# Patient Record
Sex: Male | Born: 1950 | Race: White | Hispanic: No | Marital: Single | Smoking: Current every day smoker
Health system: Southern US, Community
[De-identification: ages and names within clinical notes are randomized; demographics above are authoritative.]

## PROBLEM LIST (undated history)

## (undated) DIAGNOSIS — I739 Peripheral vascular disease, unspecified: Secondary | ICD-10-CM

## (undated) DIAGNOSIS — E1165 Type 2 diabetes mellitus with hyperglycemia: Secondary | ICD-10-CM

## (undated) DIAGNOSIS — R634 Abnormal weight loss: Secondary | ICD-10-CM

## (undated) DIAGNOSIS — E785 Hyperlipidemia, unspecified: Secondary | ICD-10-CM

## (undated) DIAGNOSIS — N529 Male erectile dysfunction, unspecified: Secondary | ICD-10-CM

## (undated) HISTORY — DX: Hyperlipidemia, unspecified: E78.5

## (undated) HISTORY — DX: Peripheral vascular disease, unspecified: I73.9

## (undated) HISTORY — DX: Male erectile dysfunction, unspecified: N52.9

## (undated) HISTORY — DX: Abnormal weight loss: R63.4

## (undated) HISTORY — DX: Type 2 diabetes mellitus with hyperglycemia: E11.65

---

## 1988-10-25 HISTORY — PX: CHOLECYSTECTOMY: SHX55

## 1993-10-25 HISTORY — PX: KNEE SURGERY: SHX244

## 1998-06-16 ENCOUNTER — Ambulatory Visit (HOSPITAL_BASED_OUTPATIENT_CLINIC_OR_DEPARTMENT_OTHER): Admission: RE | Admit: 1998-06-16 | Discharge: 1998-06-16 | Payer: Self-pay | Admitting: Orthopedic Surgery

## 2001-10-25 HISTORY — PX: BACK SURGERY: SHX140

## 2003-11-22 ENCOUNTER — Inpatient Hospital Stay (HOSPITAL_COMMUNITY): Admission: RE | Admit: 2003-11-22 | Discharge: 2003-11-24 | Payer: Self-pay | Admitting: Specialist

## 2010-08-27 ENCOUNTER — Ambulatory Visit: Payer: Self-pay | Admitting: Family Medicine

## 2010-08-27 DIAGNOSIS — N529 Male erectile dysfunction, unspecified: Secondary | ICD-10-CM

## 2010-08-27 DIAGNOSIS — R634 Abnormal weight loss: Secondary | ICD-10-CM

## 2010-08-27 DIAGNOSIS — I739 Peripheral vascular disease, unspecified: Secondary | ICD-10-CM

## 2010-08-27 DIAGNOSIS — E785 Hyperlipidemia, unspecified: Secondary | ICD-10-CM | POA: Insufficient documentation

## 2010-08-27 HISTORY — DX: Peripheral vascular disease, unspecified: I73.9

## 2010-08-27 HISTORY — DX: Abnormal weight loss: R63.4

## 2010-08-27 HISTORY — DX: Hyperlipidemia, unspecified: E78.5

## 2010-08-27 HISTORY — DX: Male erectile dysfunction, unspecified: N52.9

## 2010-08-27 LAB — CONVERTED CEMR LAB
Bilirubin Urine: NEGATIVE
Blood in Urine, dipstick: NEGATIVE
Ketones, urine, test strip: NEGATIVE
Nitrite: NEGATIVE
Specific Gravity, Urine: 1.02
Urobilinogen, UA: 0.2
WBC Urine, dipstick: NEGATIVE
pH: 5.5

## 2010-08-28 LAB — CONVERTED CEMR LAB
ALT: 15 units/L (ref 0–53)
AST: 16 units/L (ref 0–37)
Albumin: 3.9 g/dL (ref 3.5–5.2)
Alkaline Phosphatase: 91 units/L (ref 39–117)
BUN: 11 mg/dL (ref 6–23)
Basophils Absolute: 0.1 10*3/uL (ref 0.0–0.1)
Basophils Relative: 0.8 % (ref 0.0–3.0)
Bilirubin, Direct: 0.1 mg/dL (ref 0.0–0.3)
CO2: 29 meq/L (ref 19–32)
Calcium: 9.3 mg/dL (ref 8.4–10.5)
Chloride: 103 meq/L (ref 96–112)
Cholesterol: 280 mg/dL — ABNORMAL HIGH (ref 0–200)
Creatinine, Ser: 1 mg/dL (ref 0.4–1.5)
Direct LDL: 206.6 mg/dL
Eosinophils Absolute: 0.1 10*3/uL (ref 0.0–0.7)
Eosinophils Relative: 0.4 % (ref 0.0–5.0)
GFR calc non Af Amer: 76.98 mL/min (ref >60)
Glucose, Bld: 265 mg/dL — ABNORMAL HIGH (ref 70–99)
HCT: 51.2 % (ref 39.0–52.0)
HDL: 41.3 mg/dL (ref >39.00)
Hemoglobin: 17.9 g/dL — ABNORMAL HIGH (ref 13.0–17.0)
Lymphocytes Relative: 24.5 % (ref 12.0–46.0)
Lymphs Abs: 2.9 10*3/uL (ref 0.7–4.0)
MCHC: 34.9 g/dL (ref 30.0–36.0)
MCV: 91.3 fL (ref 78.0–100.0)
Monocytes Absolute: 0.8 10*3/uL (ref 0.1–1.0)
Monocytes Relative: 6.3 % (ref 3.0–12.0)
Neutro Abs: 8.2 10*3/uL — ABNORMAL HIGH (ref 1.4–7.7)
Neutrophils Relative %: 68 % (ref 43.0–77.0)
PSA: 1.02 ng/mL (ref 0.10–4.00)
Platelets: 209 10*3/uL (ref 150.0–400.0)
Potassium: 4.7 meq/L (ref 3.5–5.1)
RBC: 5.61 M/uL (ref 4.22–5.81)
RDW: 13.3 % (ref 11.5–14.6)
Sodium: 138 meq/L (ref 135–145)
TSH: 0.59 microintl units/mL (ref 0.35–5.50)
Testosterone: 375.76 ng/dL (ref 350.00–890.00)
Total Bilirubin: 0.8 mg/dL (ref 0.3–1.2)
Total CHOL/HDL Ratio: 7
Total Protein: 6.9 g/dL (ref 6.0–8.3)
Triglycerides: 223 mg/dL — ABNORMAL HIGH (ref 0.0–149.0)
VLDL: 44.6 mg/dL — ABNORMAL HIGH (ref 0.0–40.0)
WBC: 12 10*3/uL — ABNORMAL HIGH (ref 4.5–10.5)

## 2010-09-03 ENCOUNTER — Ambulatory Visit: Payer: Self-pay | Admitting: Family Medicine

## 2010-09-03 DIAGNOSIS — IMO0001 Reserved for inherently not codable concepts without codable children: Secondary | ICD-10-CM

## 2010-09-03 DIAGNOSIS — E119 Type 2 diabetes mellitus without complications: Secondary | ICD-10-CM

## 2010-09-03 HISTORY — DX: Reserved for inherently not codable concepts without codable children: IMO0001

## 2010-09-03 LAB — CONVERTED CEMR LAB: Blood Glucose, Fasting: 248 mg/dL

## 2010-09-04 ENCOUNTER — Ambulatory Visit: Payer: Self-pay | Admitting: Family Medicine

## 2010-09-04 LAB — CONVERTED CEMR LAB: Hgb A1c MFr Bld: 10.6 % — ABNORMAL HIGH (ref 4.6–6.5)

## 2010-10-07 ENCOUNTER — Ambulatory Visit: Payer: Self-pay | Admitting: Family Medicine

## 2010-10-08 LAB — CONVERTED CEMR LAB
ALT: 12 units/L (ref 0–53)
AST: 15 units/L (ref 0–37)
Albumin: 3.6 g/dL (ref 3.5–5.2)
Alkaline Phosphatase: 67 units/L (ref 39–117)
Bilirubin, Direct: 0.1 mg/dL (ref 0.0–0.3)
Cholesterol: 221 mg/dL — ABNORMAL HIGH (ref 0–200)
Direct LDL: 157.6 mg/dL
HDL: 34.5 mg/dL — ABNORMAL LOW (ref >39.00)
Total Bilirubin: 0.9 mg/dL (ref 0.3–1.2)
Total CHOL/HDL Ratio: 6
Total Protein: 6.7 g/dL (ref 6.0–8.3)
Triglycerides: 147 mg/dL (ref 0.0–149.0)
VLDL: 29.4 mg/dL (ref 0.0–40.0)

## 2010-11-17 ENCOUNTER — Encounter: Payer: Self-pay | Admitting: Family Medicine

## 2010-11-21 ENCOUNTER — Encounter: Payer: Self-pay | Admitting: Family Medicine

## 2010-11-26 NOTE — Assessment & Plan Note (Signed)
Summary: head/chest congestion/njr   Vital Signs:  Patient profile:   60 year old male O2 Sat:      96 % on Room air Temp:     98.1 degrees F oral Pulse rate:   73 / minute BP sitting:   110 / 68  (left arm) Cuff size:   regular  Vitals Entered By: Sid Falcon LPN (September 04, 2010 1:50 PM)  O2 Flow:  Room air  History of Present Illness: Patient followup with respiratory illness started couple days ago. Cough which is mostly nonproductive. Some associated nasal congestion. No fever. Has refused flu vaccine. Long history of smoking.  recent diagnosis type 2 diabetes. Started metformin and did have some diarrhea. Has backed off to once daily. Does not check blood sugar yet. Also started simvastatin for hyperlipidemia tolerating well.  Erectile dysfunction. Samples of Cialis 5 mg daily which seemed to be working very well and he is requesting prescription for that.  Allergies (verified): No Known Drug Allergies  Past History:  Past Medical History: Hyperlipidemia Kidney stones Type 2 diabetes Erectile dysfunction PMH reviewed for relevance  Review of Systems      See HPI  Physical Exam  General:  Well-developed,well-nourished,in no acute distress; alert,appropriate and cooperative throughout examination Head:  Normocephalic and atraumatic without obvious abnormalities. No apparent alopecia or balding. Ears:  External ear exam shows no significant lesions or deformities.  Otoscopic examination reveals clear canals, tympanic membranes are intact bilaterally without bulging, retraction, inflammation or discharge. Hearing is grossly normal bilaterally. Mouth:  Oral mucosa and oropharynx without lesions or exudates.  Teeth in good repair. Neck:  No deformities, masses, or tenderness noted. Lungs:  Normal respiratory effort, chest expands symmetrically. Lungs are clear to auscultation, no crackles or wheezes. Heart:  normal rate and regular rhythm.     Impression &  Recommendations:  Problem # 1:  VIRAL URI (ICD-465.9) No antibiiotics rec at this time.  Pt will start only if progressive productive cough or new fever. His updated medication list for this problem includes:    Aspirin 325 Mg Tabs (Aspirin) ..... Once daily  Problem # 2:  DIABETES MELLITUS, UNCONTROLLED (ICD-250.02) go back to once daily metformin for one week and then titrate to two times a day if toleration well. His updated medication list for this problem includes:    Aspirin 325 Mg Tabs (Aspirin) ..... Once daily    Metformin Hcl 500 Mg Tabs (Metformin hcl) ..... One by mouth two times a day  Complete Medication List: 1)  Aspirin 325 Mg Tabs (Aspirin) .... Once daily 2)  Vitamins  .... Once daily 3)  Simvastatin 20 Mg Tabs (Simvastatin) .... One tab daily at bedtime 4)  Metformin Hcl 500 Mg Tabs (Metformin hcl) .... One by mouth two times a day 5)  Onetouch Ultra System W/device Kit (Blood glucose monitoring suppl) .... Disp at ov today 09/03/10 6)  Onetouch Delica Lancets Misc (Lancets) .... Use 3-4 times a  week as directed 7)  Onetouch Test Strp (Glucose blood) .... Use 3-4 times a week as directed 8)  Cialis 5 Mg Tabs (Tadalafil) .... One by mouth once daily 9)  Doxycycline Hyclate 100 Mg Caps (Doxycycline hyclate) .... One by mouth two times a day for 10 days  Patient Instructions: 1)  Acute Bronchitis symptoms for less then 10 days are not  helped by antibiotics. Take over the counter cough medications. Call if no improvement in 5-7 days, sooner if increasing cough, fever, or new  symptoms ( shortness of breath, chest pain) .  Prescriptions: DOXYCYCLINE HYCLATE 100 MG CAPS (DOXYCYCLINE HYCLATE) one by mouth two times a day for 10 days  #20 x 0   Entered and Authorized by:   Evelena Peat MD   Signed by:   Evelena Peat MD on 09/04/2010   Method used:   Print then Give to Patient   RxID:   [id number] CIALIS 5 MG TABS (TADALAFIL) one by mouth once daily  #30 x 6    Entered and Authorized by:   Evelena Peat MD   Signed by:   Evelena Peat MD on 09/04/2010   Method used:   Print then Give to Patient   RxID:   [id number]    Orders Added: 1)  Est. Patient Level III [32951]

## 2010-11-26 NOTE — Assessment & Plan Note (Signed)
Summary: BRAND NEW PT/TO EST/PT REQ CPX/PT COMING IN FASTING/SELF PAY/CJR   Vital Signs:  Patient profile:   60 year old male Height:      70.25 inches Weight:      167 pounds BMI:     23.88 Temp:     98.5 degrees F oral Pulse rate:   88 / minute Pulse rhythm:   regular Resp:     12 per minute BP sitting:   120 / 78  (left arm) Cuff size:   regular  Vitals Entered By: Sid Falcon LPN (August 27, 2010 10:33 AM)   History of Present Illness: Here to establish.    No regular care or any CPE for years. Reports weight loss of about 30 pounds over past few years. Loss of muscle mass since back (cervical) surgery back in 2003. Increase fatigue and loss of stamina. One year problem of erectile dysfunction.  Does smoke. Also some mild curvature of penis.  Preventive Screening-Counseling & Management  Alcohol-Tobacco     Smoking Status: current     Packs/Day: 2.0     Year Started: 1965  Caffeine-Diet-Exercise     Does Patient Exercise: no  Allergies (verified): No Known Drug Allergies  Past History:  Family History: Last updated: 08/27/2010 Father, heart disease, deceased 62 heart attack Mother, good health Grandfather, lung [place]  Social History: Last updated: 08/27/2010 Occupation:  Self employed Single Current Smoker Alcohol use-yes, very occasional Regular exercise-no  Risk Factors: Exercise: no (08/27/2010)  Risk Factors: Smoking Status: current (08/27/2010) Packs/Day: 2.0 (08/27/2010)  Past Medical History: Hyperlipidemia Kidney stones  Past Surgical History: Cholecystectomy 1990 Left knee surgery 1995 Back surgery 2003 PMH-FH-SH reviewed for relevance  Family History: Father, heart disease, deceased 58 heart attack Mother, good health Grandfather, lung [place]  Social History: Occupation:  Self employed Single Current Smoker Alcohol use-yes, very occasional Regular exercise-no Occupation:  employed Smoking Status:  current Packs/Day:   2.0 Does Patient Exercise:  no  Review of Systems       The patient complains of weight loss and muscle weakness.  The patient denies anorexia, fever, weight gain, vision loss, hoarseness, chest pain, syncope, dyspnea on exertion, peripheral edema, prolonged cough, headaches, hemoptysis, abdominal pain, melena, hematochezia, severe indigestion/heartburn, hematuria, incontinence, suspicious skin lesions, transient blindness, depression, enlarged lymph nodes, and testicular masses.    Physical Exam  General:  Well-developed,well-nourished,in no acute distress; alert,appropriate and cooperative throughout examination Head:  Normocephalic and atraumatic without obvious abnormalities. No apparent alopecia or balding. Eyes:  pupils equal, pupils round, and pupils reactive to light.   Ears:  External ear exam shows no significant lesions or deformities.  Otoscopic examination reveals clear canals, tympanic membranes are intact bilaterally without bulging, retraction, inflammation or discharge. Hearing is grossly normal bilaterally. Mouth:  Oral mucosa and oropharynx without lesions or exudates.  Teeth in good repair. Neck:  No deformities, masses, or tenderness noted. Lungs:  Normal respiratory effort, chest expands symmetrically. Lungs are clear to auscultation, no crackles or wheezes. Heart:  normal rate, regular rhythm, and no gallop.   Abdomen:  soft, non-tender, no distention, no masses, no hepatomegaly, and no splenomegaly.   Rectal:  No external abnormalities noted. Normal sphincter tone. No rectal masses or tenderness. Prostate:  Prostate gland firm and smooth, no enlargement, nodularity, tenderness, mass, asymmetry or induration. Msk:  No deformity or scoliosis noted of thoracic or lumbar spine.   Extremities:  L foot sl cool to touch c/w R.  Slight slower cap  perfusion R compared with L. Neurologic:  alert & oriented X3, cranial nerves II-XII intact, and strength normal in all extremities.     Skin:  no rashes.   Cervical Nodes:  No lymphadenopathy noted Psych:  normally interactive, good eye contact, not anxious appearing, and not depressed appearing.     Impression & Recommendations:  Problem # 1:  Preventive Health Care (ICD-V70.0) discussed smoking cessation.  Needs colonoscopy screening but has no insurance and unable at this time. consider hemoccult cards.  Flu vaccine offered. He will check on last tetanus.  Problem # 2:  IMPOTENCE OF ORGANIC ORIGIN (ICD-607.84) samples Cialis 5 mg daily given. check testosterone levels with fatigue issues.  Problem # 3:  WEIGHT LOSS (ICD-783.21) check screening labs.   Problem # 4:  PERIPHERAL VASCULAR DISEASE (ICD-443.9) clinically very likely PVD LLE > RLE.  He is encouraged to stop smoking and cont ASA.  He needs arterial dopplers but he is  reluctant sec to cost at this time.  Complete Medication List: 1)  Aspirin 325 Mg Tabs (Aspirin) .... Once daily 2)  Vitamins  .... Once daily  Other Orders: UA Dipstick w/o Micro (automated)  (81003) Venipuncture (47829) Specimen Handling (56213) TLB-Lipid Panel (80061-LIPID) TLB-BMP (Basic Metabolic Panel-BMET) (80048-METABOL) TLB-CBC Platelet - w/Differential (85025-CBCD) TLB-Hepatic/Liver Function Pnl (80076-HEPATIC) TLB-TSH (Thyroid Stimulating Hormone) (84443-TSH) TLB-PSA (Prostate Specific Antigen) (84153-PSA) TLB-Testosterone, Total (84403-TESTO)  Patient Instructions: 1)  Cialis 5 mg one daily as needed    Orders Added: 1)  UA Dipstick w/o Micro (automated)  [81003] 2)  Venipuncture [08657] 3)  Specimen Handling [99000] 4)  TLB-Lipid Panel [80061-LIPID] 5)  TLB-BMP (Basic Metabolic Panel-BMET) [80048-METABOL] 6)  TLB-CBC Platelet - w/Differential [85025-CBCD] 7)  TLB-Hepatic/Liver Function Pnl [80076-HEPATIC] 8)  TLB-TSH (Thyroid Stimulating Hormone) [84443-TSH] 9)  TLB-PSA (Prostate Specific Antigen) [84153-PSA] 10)  TLB-Testosterone, Total [84403-TESTO] 11)   New Patient 40-64 years [99386]     Laboratory Results   Urine Tests    Routine Urinalysis   Color: yellow Appearance: Clear Glucose: 2+   (Normal Range: Negative) Bilirubin: negative   (Normal Range: Negative) Ketone: negative   (Normal Range: Negative) Spec. Gravity: 1.020   (Normal Range: 1.003-1.035) Blood: negative   (Normal Range: Negative) pH: 5.5   (Normal Range: 5.0-8.0) Protein: trace   (Normal Range: Negative) Urobilinogen: 0.2   (Normal Range: 0-1) Nitrite: negative   (Normal Range: Negative) Leukocyte Esterace: negative   (Normal Range: Negative)    Comments: Rita Ohara  August 27, 2010 11:54 AM

## 2010-11-26 NOTE — Assessment & Plan Note (Signed)
Summary: CBG & A1C/nn//NEEDED TO SEE DR. BURCHETTE//ALP   Vital Signs:  Patient profile:   60 year old male Weight:      171 pounds Temp:     98.1 degrees F oral BP sitting:   112 / 70  (left arm) Cuff size:   regular  Vitals Entered By: Duard Brady LPN (September 03, 2010 9:21 AM) CC: elevated CBG 248 fasting   History of Present Illness: Here today for followup fasting glucose. Recent fasting blood sugar 265. He has had some increased thirst but no urine frequency.  Weight loss of over 20 pounds this year.  Testosterone levels were normal. Lipids were very high and he started simvastatin. He has made some dietary changes with reduction of sugar-containing beverages.  Allergies (verified): No Known Drug Allergies  Past History:  Past Surgical History: Last updated: 08/27/2010 Cholecystectomy 1990 Left knee surgery 1995 Back surgery 2003  Family History: Last updated: 08/27/2010 Father, heart disease, deceased 55 heart attack Mother, good health Grandfather, lung [place]  Social History: Last updated: 08/27/2010 Occupation:  Self employed Single Current Smoker Alcohol use-yes, very occasional Regular exercise-no  Risk Factors: Exercise: no (08/27/2010)  Risk Factors: Smoking Status: current (08/27/2010) Packs/Day: 2.0 (08/27/2010)  Past Medical History: Hyperlipidemia Kidney stones Type 2 diabetes PMH-FH-SH reviewed for relevance  Physical Exam  General:  Well-developed,well-nourished,in no acute distress; alert,appropriate and cooperative throughout examination Lungs:  Normal respiratory effort, chest expands symmetrically. Lungs are clear to auscultation, no crackles or wheezes. Heart:  Normal rate and regular rhythm. S1 and S2 normal without gallop, murmur, click, rub or other extra sounds.   Impression & Recommendations:  Problem # 1:  DIABETES MELLITUS, UNCONTROLLED (ICD-250.02) Assessment New start metformin 500 mg b.i.d. Educational materials  given. Home glucose monitor given with instructions and will monitor at least twice weekly His updated medication list for this problem includes:    Aspirin 325 Mg Tabs (Aspirin) ..... Once daily    Metformin Hcl 500 Mg Tabs (Metformin hcl) ..... One by mouth two times a day  Problem # 2:  HYPERLIPIDEMIA (ICD-272.4)  His updated medication list for this problem includes:    Simvastatin 20 Mg Tabs (Simvastatin) ..... One tab daily at bedtime  Complete Medication List: 1)  Aspirin 325 Mg Tabs (Aspirin) .... Once daily 2)  Vitamins  .... Once daily 3)  Simvastatin 20 Mg Tabs (Simvastatin) .... One tab daily at bedtime 4)  Metformin Hcl 500 Mg Tabs (Metformin hcl) .... One by mouth two times a day 5)  Onetouch Ultra System W/device Kit (Blood glucose monitoring suppl) .... Disp at ov today 6)  Onetouch Delica Lancets Misc (Lancets) .... Use 3-4 times a  week as directed 7)  Onetouch Test Strp (Glucose blood) .... Use 3-4 times a week as directed  Patient Instructions: 1)  Continue to reduce sugars and starches in diet. 2)  Check blood sugars at least once or twice per week and give [place] some feedback on readings. 3)  Please schedule a follow-up appointment in 3 months .  Prescriptions: METFORMIN HCL 500 MG TABS (METFORMIN HCL) one by mouth two times a day  #60 x 5   Entered and Authorized by:   Evelena Peat MD   Signed by:   Evelena Peat MD on 09/03/2010   Method used:   Electronically to        [facility]  [address] [id number] (retail)       [address]  [place], [place]  [address]       Ph: [phone] or [phone]       Fax: [phone]   RxID:   [id number]    Orders Added: 1)  Est. Patient Level III [27253]

## 2010-12-03 ENCOUNTER — Encounter: Payer: Self-pay | Admitting: Family Medicine

## 2010-12-03 ENCOUNTER — Ambulatory Visit (INDEPENDENT_AMBULATORY_CARE_PROVIDER_SITE_OTHER): Payer: Self-pay | Admitting: Family Medicine

## 2010-12-03 VITALS — BP 120/72 | Temp 98.2°F | Ht 67.0 in | Wt 167.0 lb

## 2010-12-03 DIAGNOSIS — E119 Type 2 diabetes mellitus without complications: Secondary | ICD-10-CM

## 2010-12-03 DIAGNOSIS — N529 Male erectile dysfunction, unspecified: Secondary | ICD-10-CM

## 2010-12-03 DIAGNOSIS — E785 Hyperlipidemia, unspecified: Secondary | ICD-10-CM

## 2010-12-03 LAB — LIPID PANEL
Cholesterol: 192 mg/dL (ref 0–200)
HDL: 41.2 mg/dL (ref >39.00)
LDL Cholesterol: 128 mg/dL — ABNORMAL HIGH (ref 0–99)
Total CHOL/HDL Ratio: 5
VLDL: 22.4 mg/dL (ref 0.0–40.0)

## 2010-12-03 LAB — HEMOGLOBIN A1C: Hgb A1c MFr Bld: 8.4 % — ABNORMAL HIGH (ref 4.6–6.5)

## 2010-12-03 MED ORDER — SIMVASTATIN 80 MG PO TABS: ORAL_TABLET | ORAL | Status: DC

## 2010-12-03 MED ORDER — SILDENAFIL CITRATE 100 MG PO TABS: 100.0000 mg | ORAL_TABLET | ORAL | Status: AC | PRN

## 2010-12-03 MED ORDER — METFORMIN HCL 500 MG PO TABS: 500.0000 mg | ORAL_TABLET | Freq: Two times a day (BID) | ORAL | Status: DC

## 2010-12-03 NOTE — Progress Notes (Signed)
  Subjective:    Patient ID: Adam [name], male    DOB: 1951-07-20, 60 y.o.   MRN: [id number]  HPI   Patient seen for followup.   Type 2 diabetes with improvement in symptoms. On metformin 500 mg twice daily. No symptoms of hyperglycemia. Most recent A1c which was baseline of 10.6. Here today for followup. Overall feels much better.   erectile dysfunction. Some relief with Cialis. Still having problems with erection. Still smoking.    hyperlipidemia treated with simvastatin with recent increase in dosage to 40 mg daily. No myalgias. No history of CAD  Review of Systems  patient denies any appetite change or weight change. No headaches. No dyspnea. No chest pains.    Objective:   Physical Exam     Patient alert and in no distress.  Neck exam no mass  Chest clear to auscultation  Heart regular rhythm and rate with no murmur  Extremities no edema no foot lesions    Assessment & Plan:   #1   Type 2 diabetes. Improve symptomatically. Repeat hemoglobin A1c.    #2 erectile dysfunction. Try Viagra 100 mg one half to one tablet daily as needed  #3   Hyperlipidemia. Recheck lipid panel

## 2010-12-07 MED ORDER — METFORMIN HCL 500 MG PO TABS: ORAL_TABLET | ORAL | Status: DC

## 2010-12-07 NOTE — Progress Notes (Signed)
Addended by: Sid Falcon on: 12/07/2010 12:33 PM   Modules accepted: Orders

## 2010-12-07 NOTE — Progress Notes (Signed)
Quick Note:  Pt informed he needs to begin taking 1 tab in am, 2 tabs in pm. Pt will need Rx sent to his pharmacy to increase to 3 daily  ______

## 2011-03-12 NOTE — Op Note (Signed)
NAME:  Adam [name], Adam [name]                            ACCOUNT NO.:  [id number]   MEDICAL RECORD NO.:  [id number]                   PATIENT TYPE:  INP   LOCATION:  2861                                 FACILITY:  [facility]   PHYSICIAN:  Kerrin Champagne, M.D.                DATE OF BIRTH:  April 22, 1951   DATE OF PROCEDURE:  11/22/2003  DATE OF DISCHARGE:                                 OPERATIVE REPORT   PREOPERATIVE DIAGNOSIS:  Herniated nucleus pulposus in the left C6-7,  central herniated nucleus pulposus in the left C5-6, atrophic changes in the  left upper extremity secondary to nerve root compression.   POSTOPERATIVE DIAGNOSIS:  Herniated nucleus pulposus in the left C6-7,  central herniated nucleus pulposus in the left C5-6, atrophic changes in the  left upper extremity secondary to nerve root compression.   PROCEDURE:  Anterior cervical diskectomy and fusion in the C5-6, C6-7 with  right iliac crest bone graft harvested through [name] separate incision. Internal  fixation over the 2 levels using 49-mm DePuy locking blade and screws, 14-mm  screws were used, 2 revision screws were used on the right side at C7, on  the left side at C6.   SURGEON:  Kerrin Champagne, M.D.   ASSISTANT:  Wende Neighbors, P.[name].   ANESTHESIA:  GOT, Bedelia Person, M.D.   ESTIMATED BLOOD LOSS:  75 mL.   DRAINS:  [name] 10 French TLS drain left neck. Foley catheter to straight drain.   ESTIMATED BLOOD LOSS:  75 mL.   COMPLICATIONS:  None.   INDICATIONS FOR PROCEDURE:  The patient is [name] who has been  followed  with problems of disk protrusion in his neck, radiation into the  left upper extremity, developing progressive weakness in the left arm and  atrophy of his muscle, consistent with cervical radiculopathy in the C7  distribution. Studies have demonstrated disk herniation with spur formation  into the left C6-7 neuroforamen affecting triceps and left finger extension.  The patient also has [name] disk  protrusion in the C5-6 which is centrally  oriented. He  is taken to the operating room to undergo anterior cervical  diskectomy and fusion in both the C5-6 and C6-7 to relieve problems of  cervical radiculopathy.   FINDINGS:  Severe foraminal entrapment in the left C7 nerve root secondary  to uncovertebral spondylosis changes affecting the C7 nerve root. Disk  protrusion central right and leftward at the C5-6 level.   DESCRIPTION OF PROCEDURE:  After adequate general anesthesia, [name] Foley  catheter was placed and the patient was in the beachchair position. All  pressure points were well padded, and  5 pounds of cervical halter traction  with Mayfield horseshoe well padded to hold the occiput. Draped in the usual  manner. [name] bump under the right iliac crest. Standard prep with Duraprep  solution and then draped as noted. The right  iliac crest was iodine vidraped  as was the anterior neck.   An incision over  the left anterior neck approximately 3inches in length in  line with the patient's skin creases at the expected C6-7 level 2  fingerbreadths above the medial clavicle. Incision through skin  and subcu  layers directly down to the platysma layer. This was incised in line with  the skin incision and then developed. Blunt dissection was then used to  develop the interval between  the trachea and esophagus medially, brought [name]  sheath laterally to the anterior surface of  the cervical spine. The  prevertebral fascia was incised along the medial border of the longus coli  muscle and  teased across the midline using the key elevator. Cauterization  was used to free up the prevertebral fascia using bipolar electrocautery.   Spinal needles with sheaths left intact and allowing for only about 1 cm to  be inserted at each level were inserted at  the C5-6 and C6-7 level.  Intraoperative C-arm fluoroscopy was used to demonstrate the needles at the  C5-6 and C6-7 levels in both the AP and lateral  planes. The medial border of  the longus coli muscle was carefully freed up on both sides of the cervical  spine at the C5-6 and C6-7 levels. Spinal needles were removed under direct  observation and [name] small portion of the disk excised above C5-6 and C6-7 for  continued identification throughout the remainder of the case.   The longus coli muscle was freed up using electrocautery, then the  [facility] retractor was placed with the blade beneath the medial border of  the longus coli muscle at the C6-7 level initially. Bovie electrocautery was  used to carefully used to  cauterize soft tissue over the anterior aspect of  the cervical spine, extending from C7 upwards to C5, carefully cauterizing  this area down to bone to allow for placement of traction pins and later on  for play placement. The 14-mm distraction screws were placed in the  vertebral body of C6 and C7. Distraction was obtained across the  intervertebral disk space.   The anterior annular disk material was excised using [name] #15 blade  scalpel.  Pituitary rongeurs as well as 3-mm Kerrison. Anterior lip osteophytes were  resected using 3-mm Kerrisons. The disk was then debrided of disk material  using pituitary rongeurs as well as micro curets back 2/3rds of the way to  the posterior aspect of the disk space.   The intraoperative microscope was then brought into the field under sterile  conditions and then the remaining posterior portion of the disk was excised  using micro curets, pituitary rongeurs. The high-speed bur  was used to  carefully perform, opening into the uncovertebral region on the left side.  Posterior lip osteophytes were resected using 1-mm Kerrisons as well as 3-0  microcurets.   The left neuroforamen for C7 was noted to be significantly narrow as well as  multiple small  fragments of disk material entering in the left  neuroforamen. These were removed using  micro pituitaries as well as Kerrisons. The  uncovertebral osteophyte was resected on the left side such  that the left C7 nerve root eventually was noted to be making its turn and  returning anteriorly  and laterally, exiting from the neuroforamen, at which  point it was felt that decompression was complete.   Irrigation was performed. The high-speed bur was used to carefully smooth  the endplates at  both the inferior aspect of C6, superior aspect of C7. The  height in the intervertebral disk space was measured using [name] sounder; the #8  sounder gave the best fit. Iliac crest bone graft was harvested in [name]  tricortical fashion from the right side through an incision about 2 to 2-1/2  inches in length through the skin and subcutaneous layers and about  2-1/2 to 3 inches back from the anterior superior iliac spine. The incision  through the skin and subcutaneous layers directly down to the periosteum in  the interval between  the  abdominal  fascia and the upper thigh fascia.   Electrocautery was used to carefully perform subperiosteal dissection medial  and lateral. Retractors were placed. The oscillating saw set at 8 mm was  then used to incise the crest. This was cut across the base with [name] 1/4-inch  osteotome protecting soft tissue structures with [employer] as well as Cobb  elevators.   This was then removed. Carefully tapered dimensions in the intervertebral  disk space. The intervertebral disk space height was measured using the  sounder provided, the depth measured at 18-mm using the Cloward depth gauge.  [name] 14-mm depth graft was chosen. The height as stated.   Carefully the graft was tapered to the dimensions in the intervertebral disk  space. Irrigation was performed in the intervertebral disk space. Care was  taken to ensure no remaining soft tissue was within the disk space that  could be retropulsed with insertion of the graft. The graft  was then  inserted and impacted into place, sub set  about  1 to 2 mm.   The  distraction was removed. The distraction pins were then removed from the  C7 vertebral body. Bone was applied to the cancellous bone surface area. The  McCullough retractor was then carefully removed and then reinserted at the  C5-6 level with the blade beneath the medial border of the longus coli  muscle. Distraction was obtained and excellent visualization of the C5-6  level was obtained. [name] distracting pin was then placed parallel to that of  the C6 level into the C5 vertebral body. Distraction was obtained across  this disk space.   The #15 blade  scalpel was used to incise the disk. Pituitary rongeurs were  used to incise the disk material from C5-6 anteriorly. The operating room  microscope was brought into the field under direct visualization, then the  anterior lip osteophytes were resected off the C5-6 level using 3-mm  Kerrisons. The high-speed bur was used to remove the cartilaginous endplates  both at the inferior aspect of the C5 and the superior aspect of the C6 back to the posterior lip osteophytes which were present here. These were  resected using 1-mm Kerrison as well as [name] 3-0 forward angle microcuret.   The posterior annular fibers were resected. The posterior longitudinal  ligament was resected. Disk material was found to be present within the  centrally and towards the right side. This was resected using micro  pituitary rongeurs. [name] foraminotomy was performed over the left side at C5-6,  decompressing the left C6 nerve root. Irrigation was performed.   The height of the intervertebral disk space was measured with an 8-mm  sounder and found to be excellent size. The depth was measured and again  measuring 18-mm in depth at this level. The iliac crest bone graft was  harvested from the right side using [name] dual oscillating saw provided. It was  divided across  its base with [name] 1/4-inch osteotome.   This graft was carefully tapered to the dimensions of the intervertebral   disk space, 14 mm in depth at [name] height of 8 mm. It was  carefully keyed to  allow for insertion into the disk space. After inspection of the disk space  and further  irrigation of the soft tissue felt to be present, it could be  retropulsed with insertion of the graft. The graft was then inserted in  place and packed into place. Retro set or sub set about 1 mm below the  anterior lip of the C5-6 level.   The screw post was then removed from both C5 and C6. Bone wax was applied to  the bleeding screw post holes. Electrocautery was used to carefully  cauterize soft tissue bleeders. The small thyroid artery that appeared  to  be begin bleeding during the case was carefully suture ligated off over its  medial lateral  aspect.   The high-speed bur  under direct visualization with loupe magnification was  then used to carefully smooth the anterior aspect of the disk space at C5-6  and C6-7, removing anterior lip osteophytes and smoothing this area for  acceptance of the plate. The expected plate length was measured using bone  wax coated cottonoid string and placed over the anterior cervical spine,  measuring almost 50 mm. [name] 49-mm plate was eventually chosen.   This plate was precontoured with cervical lordotic curve was used. The 5  pounds of cervical traction was released off the neck. The plate carefully  medial to the central portion of the vertebral bodies of C5, C6 and C7.  Pinned in place using [name] temporary fixation pin at the C7 level. The screws  were placed at the C6 level, first the left side 14-mm screw and then on the  right side [name] 14-mm screw at C6. Next screws were placed at C5, 14-mm screws  were placed on both right and left side. At the C7 level then additional  screws were placed, the 14-mm on the left, 14-mm on the right. The right  screw appeared  to be somewhat loose so that [name] revision  screw was placed.   The left side C6 screw was also felt to be loose, so this was  replaced with [name] revision 14-mm screw. Each of the locking fasteners were then turned using  the screwdriver provided, rotating the nut about 90 degrees at least to  obtain fixational locking of each of the screws of the plate.   Irrigation was performed. Carefully small bleeders were controlled using  bipolar electrocautery. Inspection of the esophagus demonstrated no  abnormality. [name] 10 French TLS drain was placed in the depth of the incision,  exiting just inferior to the skin  incision and was sewn in place with [name] 4-0  nylon stitch.   Following evaluation  of the esophagus and when it was determined to be  normal. Irrigation was again  performed in the platysma layer and  then  reapproximated with interrupted 3-0 Vicryl sutures. The deep subcutaneous  layers were reapproximated with interrupted 3-0 Vicryl suture. The skin was  closed with [name] running subcu stitch of 4-0 Vicryl. Tincture of Benzoin and  Steri-Strips were then applied to the neck. The TLS drain was then charged  with  __________.   The right iliac crest bone graft harvest site was  carefully  irrigated. The  C-arm fluoroscopy was used to ascertain position  and alignment  of the plate  and indicated there was no evidence of retropulsion or bone graft material,  nor was there any evidence of significant abnormality associated with plate  or screw length.   The right iliac crest was irrigated with copious amounts of irrigant  solution. Bone wax was applied to the bleeding cancellous bone surfaces as  was Gelfoam. The abdominal fascia was reapproximated to the upper thigh  fascia using interrupted #1 Vicryl sutures. The deep subcu layers were  reapproximated with interrupted #1 and  0  Vicryl sutures. The more  superficial layers with interrupted 2-0 Vicryl sutures and the skin was  closed with [name] running subcu stitch of 4-0 Vicryl. Tincture of Benzoin and  Steri-Strips were applied. [name] dry dressing and  4 x 4 were affixed to  the  skin with Hypafix tape over the right iliac crest and left neck.   The patient was then placed into [name] [place] collar, reactivated,  extubated and returned to the recovery room in satisfactory condition. All  sponge and instrument counts were correct.                                               Kerrin Champagne, M.D.    Myra Rude  D:  11/22/2003  T:  11/23/2003  Job:  [id number]

## 2011-03-12 NOTE — Discharge Summary (Signed)
NAME:  Adam [name], Adam [name]                            ACCOUNT NO.:  [id number]   MEDICAL RECORD NO.:  [id number]                   PATIENT TYPE:  INP   LOCATION:  5037                                 FACILITY:  [facility]   PHYSICIAN:  Kerrin Champagne, M.D.                DATE OF BIRTH:  Apr 14, 1951   DATE OF ADMISSION:  11/22/2003  DATE OF DISCHARGE:  11/24/2003                                 DISCHARGE SUMMARY   ADMISSION DIAGNOSES:  Herniated nucleus pulposus, left C6-7. Herniated  nucleus pulposus, left C5-6, with atrophic changes in the left upper  extremity secondary to nerve root compression.   DISCHARGE DIAGNOSES:  Herniated nucleus pulposus, left C6-7. Herniated  nucleus pulposus, left C5-6, with atrophic changes in the left upper  extremity secondary to nerve root compression.   PROCEDURE:  On November 22, 2003, the patient underwent anterior cervical  diskectomy and fusion at C5-6 and C6-7 with right iliac crest bone graft  harvested through [name] separate incision. This was performed by Dr. Otelia Sergeant,  assisted by Wende Neighbors, P.[name].-C, under general anesthesia.   CONSULTS:  None.   BRIEF HISTORY:  The patient is [name] 60 year old [place] male with chronic pain  and atrophic changes in the left upper extremity secondary to [name] disk  protrusion at left C6-7 as well as [name] disk protrusion at C5-6. His exam has  shown progressive weakness in the left upper extremity and atrophy of his  muscle consistent with cervical radiculopathy in the C7 distribution.  Studies have confirmed disk herniation with spur formation into the left C6-  7 neural foramen affecting triceps and left finger extension as well as [name]  disk protrusion at C5-6 level which is centrally located. It was felt he  would require surgical intervention and was admitted for the procedure as  stated above.   BRIEF HOSPITAL COURSE:  The patient tolerated the procedure under general  anesthesia without complications. Postoperatively,  neurovascular motor  function of the upper extremities was intact. He was treated for pain  control with PCA analgesics and weaned to p.o. analgesics without  difficulty. Sore throat was treated with Cepacol lozenges successfully. The  patient received [name] physical therapy consult for ambulation and gait  training. He tolerated this well and was independent with activity at the  time of discharge. On the first postoperative day, his drain was removed  from the anterior cervical wound. Dressing changes were done daily  thereafter with the wound healing well. Daily dressing changes were also  done on the right hip, and this wound was noted to be healing without  drainage as well. On the second postoperative day, the patient was  comfortable with oral analgesics. He was able to eat and swallow well. The  patient was afebrile with vital signs stable and felt ready for discharge to  home.   PERTINENT LABORATORY DATA:  Admission labs include CBC  with RBC 5.93,  hemoglobin 18.1, hematocrit 52.4, WBC 8.8. Coagulation studies were within  normal limits. Chemistry studies were normal with the exception of glucose  301. Urinalysis on admission was negative for urinary tract infection. EKG  on admission showed normal sinus rhythm with left axis deviation. No old  tracings for comparison, confirmed by Dr. Dietrich Pates. Preoperative chest x-ray  is not available on the record at the time of this dictation; however, chest  x-ray documented by preanesthesia evaluation showed chronic bronchitis. The  patient was known to have [name] smoking history.   CONDITION ON DISCHARGE:  Stable.   PLAN:  The patient is discharged to his home. He is given instructions to  wear his [place] collar at all times. He is also given [name] [place] collar  which he will use for showering purposes. Dressing changes will be done  daily and patient was given supplies and instructions on doing so. He will  continue on [name] soft diet and  advance as tolerated. The patient will avoid  overhead activity and no lifting greater than 5 pounds. Prescriptions were  given Percocet p.r.n. pain and Robaxin p.r.n. spasm. He will utilize over-  the-counter stool softeners or laxatives as needed. He was instructed to  avoid use of anti-inflammatory medications or aspirin during the healing  process. He will follow up in two weeks from the date of surgery with Dr.  Otelia Sergeant and was given [name] number to call to arrange the appointment. All  questions were encouraged and answered. He will call the office if he has  problems prior to his return office visit. The patient was also advised to  followup with his primary care physician in regards to his elevated blood  sugar on admission.      Wende Neighbors, P.[name].                    Kerrin Champagne, M.D.    [facility]  D:  12/31/2003  T:  01/01/2004  Job:  [id number]

## 2011-10-05 ENCOUNTER — Other Ambulatory Visit: Payer: Self-pay | Admitting: Family Medicine

## 2011-11-01 ENCOUNTER — Other Ambulatory Visit: Payer: Self-pay | Admitting: Family Medicine

## 2011-11-26 ENCOUNTER — Other Ambulatory Visit: Payer: Self-pay | Admitting: Family Medicine

## 2011-12-23 ENCOUNTER — Other Ambulatory Visit: Payer: Self-pay | Admitting: Family Medicine

## 2013-01-06 ENCOUNTER — Other Ambulatory Visit: Payer: Self-pay | Admitting: Family Medicine

## 2013-03-10 ENCOUNTER — Other Ambulatory Visit: Payer: Self-pay | Admitting: Family Medicine

## 2013-04-18 ENCOUNTER — Other Ambulatory Visit: Payer: Self-pay | Admitting: Family Medicine

## 2013-04-18 NOTE — Telephone Encounter (Signed)
Denied--Pt last office visit 02.09.2012, 2nd month refused/SLS

## 2013-04-26 ENCOUNTER — Ambulatory Visit (INDEPENDENT_AMBULATORY_CARE_PROVIDER_SITE_OTHER): Payer: Self-pay | Admitting: Family Medicine

## 2013-04-26 ENCOUNTER — Encounter: Payer: Self-pay | Admitting: Family Medicine

## 2013-04-26 VITALS — BP 136/72 | HR 79 | Temp 98.0°F | Ht 72.0 in | Wt 175.0 lb

## 2013-04-26 DIAGNOSIS — E785 Hyperlipidemia, unspecified: Secondary | ICD-10-CM

## 2013-04-26 DIAGNOSIS — E1165 Type 2 diabetes mellitus with hyperglycemia: Secondary | ICD-10-CM

## 2013-04-26 LAB — HM DIABETES EYE EXAM

## 2013-04-26 MED ORDER — SIMVASTATIN 80 MG PO TABS: ORAL_TABLET | ORAL | Status: DC

## 2013-04-26 MED ORDER — METFORMIN HCL 500 MG PO TABS: ORAL_TABLET | ORAL | Status: DC

## 2013-04-26 NOTE — Progress Notes (Signed)
  Subjective:    Patient ID: Adam [name], male    DOB: Feb 08, 1951, 62 y.o.   MRN: [id number]  HPI  Patient seen for medical follow up. Not seen in 2 years. History of poor compliance secondary to lack of insurance and cost issues. He has history of type 2 diabetes, ongoing nicotine use, and hyperlipidemia. He's been out of his medications for about 3 weeks now. Has been taking metformin, simvastatin, aspirin.  Denies any recent chest pains. No symptoms of hyper or hypoglycemia. When testing in the past blood sugars have been mostly low 100s. Last A1c was 8.4% this was back in 2012. No recent eye exam.  Past Medical History  Diagnosis Date  . DIABETES MELLITUS, UNCONTROLLED 09/03/2010  . HYPERLIPIDEMIA 08/27/2010  . PERIPHERAL VASCULAR DISEASE 08/27/2010  . 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 WEIGHT LOSS 08/27/2010   Past Surgical History  Procedure Laterality Date  . Cholecystectomy  1990  . Knee surgery  1995    left  . Back surgery  2003    reports that he has been smoking.  He does not have any smokeless tobacco history on file. He reports that  drinks alcohol. His drug history is not on file. family history includes Cancer in his paternal grandfather and Heart disease in his father. No Known Allergies   Review of Systems  Constitutional: Negative for fatigue.  Eyes: Negative for visual disturbance.  Respiratory: Negative for cough, chest tightness and shortness of breath.   Cardiovascular: Negative for chest pain, palpitations and leg swelling.  Endocrine: Negative for polydipsia, polyphagia and polyuria.  Genitourinary: Negative for dysuria.  Neurological: Negative for dizziness, syncope, weakness, light-headedness and headaches.       Objective:   Physical Exam  Constitutional: He appears well-developed and well-nourished.  Neck: Neck supple. No thyromegaly present.  Cardiovascular: Normal rate and regular rhythm.   Pulmonary/Chest: Effort normal and breath  sounds normal. No respiratory distress. He has no wheezes. He has no rales.  Musculoskeletal: He exhibits no edema.  Skin:  Feet are warm to touch with 2+ dorsalis pedis pulses and good capillary refill. No lesions. Normal sensory function          Assessment & Plan:  #1 type 2 diabetes. History of poor compliance with medications. Refilled metformin. We elected not to do A1c today since he been off this medication for the past few weeks. Get back on medication and future labs ordered with A1c in 3 months #2 hyperlipidemia. Get back on simvastatin 40 mg daily and reassess lipids at followup in 3 months

## 2013-04-26 NOTE — Patient Instructions (Addendum)
Type 2 Diabetes Mellitus, Adult Type 2 diabetes mellitus, often simply referred to as type 2 diabetes, is a long-lasting (chronic) disease. In type 2 diabetes, the pancreas does not make enough insulin (a hormone), the cells are less responsive to the insulin that is made (insulin resistance), or both. Normally, insulin moves sugars from food into the tissue cells. The tissue cells use the sugars for energy. The lack of insulin or the lack of normal response to insulin causes excess sugars to build up in the blood instead of going into the tissue cells. As a result, high blood sugar (hyperglycemia) develops. The effect of high sugar (glucose) levels can cause many complications. Type 2 diabetes was also previously called adult-onset diabetes but it can occur at any age.  RISK FACTORS  A person is predisposed to developing type 2 diabetes if someone in the family has the disease and also has one or more of the following primary risk factors:  Overweight.  An inactive lifestyle.  A history of consistently eating high-calorie foods. Maintaining a normal weight and regular physical activity can reduce the chance of developing type 2 diabetes. SYMPTOMS  A person with type 2 diabetes may not show symptoms initially. The symptoms of type 2 diabetes appear slowly. The symptoms include:  Increased thirst (polydipsia).  Increased urination (polyuria).  Increased urination during the night (nocturia).  Weight loss. This weight loss may be rapid.  Frequent, recurring infections.  Tiredness (fatigue).  Weakness.  Vision changes, such as blurred vision.  Fruity smell to your breath.  Abdominal pain.  Nausea or vomiting.  Cuts or bruises which are slow to heal.  Tingling or numbness in the hands or feet. DIAGNOSIS Type 2 diabetes is frequently not diagnosed until complications of diabetes are present. Type 2 diabetes is diagnosed when symptoms or complications are present and when blood  glucose levels are increased. Your blood glucose level may be checked by one or more of the following blood tests:  A fasting blood glucose test. You will not be allowed to eat for at least 8 hours before a blood sample is taken.  A random blood glucose test. Your blood glucose is checked at any time of the day regardless of when you ate.  A hemoglobin A1c blood glucose test. A hemoglobin A1c test provides information about blood glucose control over the previous 3 months.  An oral glucose tolerance test (OGTT). Your blood glucose is measured after you have not eaten (fasted) for 2 hours and then after you drink a glucose-containing beverage. TREATMENT   You may need to take insulin or diabetes medicine daily to keep blood glucose levels in the desired range.  You will need to match insulin dosing with exercise and healthy food choices. The treatment goal is to maintain the before meal blood sugar (preprandial glucose) level at 70 130 mg/dL. HOME CARE INSTRUCTIONS   Have your hemoglobin A1c level checked twice a year.  Perform daily blood glucose monitoring as directed by your caregiver.  Monitor urine ketones when you are ill and as directed by your caregiver.  Take your diabetes medicine or insulin as directed by your caregiver to maintain your blood glucose levels in the desired range.  Never run out of diabetes medicine or insulin. It is needed every day.  Adjust insulin based on your intake of carbohydrates. Carbohydrates can raise blood glucose levels but need to be included in your diet. Carbohydrates provide vitamins, minerals, and fiber which are an essential part of   a healthy diet. Carbohydrates are found in fruits, vegetables, whole grains, dairy products, legumes, and foods containing added sugars.    Eat healthy foods. Alternate 3 meals with 3 snacks.  Lose weight if overweight.  Carry a medical alert card or wear your medical alert jewelry.  Carry a 15 gram  carbohydrate snack with you at all times to treat low blood glucose (hypoglycemia). Some examples of 15 gram carbohydrate snacks include:  Glucose tablets, 3 or 4   Glucose gel, 15 gram tube  Raisins, 2 tablespoons (24 grams)  Jelly beans, 6  Animal crackers, 8  Regular pop, 4 ounces (120 mL)  Gummy treats, 9  Recognize hypoglycemia. Hypoglycemia occurs with blood glucose levels of 70 mg/dL and below. The risk for hypoglycemia increases when fasting or skipping meals, during or after intense exercise, and during sleep. Hypoglycemia symptoms can include:  Tremors or shakes.  Decreased ability to concentrate.  Sweating.  Increased heart rate.  Headache.  Dry mouth.  Hunger.  Irritability.  Anxiety.  Restless sleep.  Altered speech or coordination.  Confusion.  Treat hypoglycemia promptly. If you are alert and able to safely swallow, follow the 15:15 rule:  Take 15 20 grams of rapid-acting glucose or carbohydrate. Rapid-acting options include glucose gel, glucose tablets, or 4 ounces (120 mL) of fruit juice, regular soda, or low fat milk.  Check your blood glucose level 15 minutes after taking the glucose.  Take 15 20 grams more of glucose if the repeat blood glucose level is still 70 mg/dL or below.  Eat a meal or snack within 1 hour once blood glucose levels return to normal.    Be alert to polyuria and polydipsia which are early signs of hyperglycemia. An early awareness of hyperglycemia allows for prompt treatment. Treat hyperglycemia as directed by your caregiver.  Engage in at least 150 minutes of moderate-intensity physical activity a week, spread over at least 3 days of the week or as directed by your caregiver. In addition, you should engage in resistance exercise at least 2 times a week or as directed by your caregiver.  Adjust your medicine and food intake as needed if you start a new exercise or sport.  Follow your sick day plan at any time you  are unable to eat or drink as usual.  Avoid tobacco use.  Limit alcohol intake to no more than 1 drink per day for nonpregnant women and 2 drinks per day for men. You should drink alcohol only when you are also eating food. Talk with your caregiver whether alcohol is safe for you. Tell your caregiver if you drink alcohol several times a week.  Follow up with your caregiver regularly.  Schedule an eye exam soon after the diagnosis of type 2 diabetes and then annually.  Perform daily skin and foot care. Examine your skin and feet daily for cuts, bruises, redness, nail problems, bleeding, blisters, or sores. A foot exam by a caregiver should be done annually.  Brush your teeth and gums at least twice a day and floss at least once a day. Follow up with your dentist regularly.  Share your diabetes management plan with your workplace or school.  Stay up-to-date with immunizations.  Learn to manage stress.  Obtain ongoing diabetes education and support as needed.  Participate in, or seek rehabilitation as needed to maintain or improve independence and quality of life. Request a physical or occupational therapy referral if you are having foot or hand numbness or difficulties with grooming,   dressing, eating, or physical activity. SEEK MEDICAL CARE IF:   You are unable to eat food or drink fluids for more than 6 hours.  You have nausea and vomiting for more than 6 hours.  Your blood glucose level is over 240 mg/dL.  There is a change in mental status.  You develop an additional serious illness.  You have diarrhea for more than 6 hours.  You have been sick or have had a fever for a couple of days and are not getting better.  You have pain during any physical activity.  SEEK IMMEDIATE MEDICAL CARE IF:  You have difficulty breathing.  You have moderate to large ketone levels. MAKE SURE YOU:  Understand these instructions.  Will watch your condition.  Will get help right away if  you are not doing well or get worse. Document Released: 10/11/2005 Document Revised: 07/05/2012 Document Reviewed: 05/09/2012 Exit[employer] Patient Information 2014 [employer].  

## 2014-05-07 ENCOUNTER — Other Ambulatory Visit: Payer: Self-pay | Admitting: Family Medicine

## 2014-05-09 ENCOUNTER — Encounter: Payer: Self-pay | Admitting: Family Medicine

## 2014-05-09 ENCOUNTER — Ambulatory Visit (INDEPENDENT_AMBULATORY_CARE_PROVIDER_SITE_OTHER): Payer: Medicare Other | Admitting: Family Medicine

## 2014-05-09 VITALS — BP 112/68 | HR 72 | Wt 167.0 lb

## 2014-05-09 DIAGNOSIS — E785 Hyperlipidemia, unspecified: Secondary | ICD-10-CM

## 2014-05-09 DIAGNOSIS — IMO0001 Reserved for inherently not codable concepts without codable children: Secondary | ICD-10-CM

## 2014-05-09 DIAGNOSIS — N529 Male erectile dysfunction, unspecified: Secondary | ICD-10-CM

## 2014-05-09 DIAGNOSIS — Z23 Encounter for immunization: Secondary | ICD-10-CM | POA: Diagnosis not present

## 2014-05-09 DIAGNOSIS — N528 Other male erectile dysfunction: Secondary | ICD-10-CM

## 2014-05-09 DIAGNOSIS — I739 Peripheral vascular disease, unspecified: Secondary | ICD-10-CM | POA: Diagnosis not present

## 2014-05-09 DIAGNOSIS — E1165 Type 2 diabetes mellitus with hyperglycemia: Principal | ICD-10-CM

## 2014-05-09 LAB — HM DIABETES EYE EXAM

## 2014-05-09 LAB — HEPATIC FUNCTION PANEL
ALBUMIN: 3.8 g/dL (ref 3.5–5.2)
ALT: 16 U/L (ref 0–53)
AST: 19 U/L (ref 0–37)
Alkaline Phosphatase: 82 U/L (ref 39–117)
BILIRUBIN DIRECT: 0.1 mg/dL (ref 0.0–0.3)
TOTAL PROTEIN: 7.2 g/dL (ref 6.0–8.3)
Total Bilirubin: 1.2 mg/dL (ref 0.2–1.2)

## 2014-05-09 LAB — BASIC METABOLIC PANEL
BUN: 6 mg/dL (ref 6–23)
CHLORIDE: 101 meq/L (ref 96–112)
CO2: 29 mEq/L (ref 19–32)
Calcium: 9.1 mg/dL (ref 8.4–10.5)
Creatinine, Ser: 0.9 mg/dL (ref 0.4–1.5)
GFR: 87.63 mL/min (ref >60.00)
Glucose, Bld: 186 mg/dL — ABNORMAL HIGH (ref 70–99)
Potassium: 3.4 mEq/L — ABNORMAL LOW (ref 3.5–5.1)
Sodium: 136 mEq/L (ref 135–145)

## 2014-05-09 LAB — LIPID PANEL
Cholesterol: 175 mg/dL (ref 0–200)
HDL: 44.4 mg/dL (ref >39.00)
LDL Cholesterol: 101 mg/dL — ABNORMAL HIGH (ref 0–99)
NonHDL: 130.6
TRIGLYCERIDES: 148 mg/dL (ref 0.0–149.0)
Total CHOL/HDL Ratio: 4
VLDL: 29.6 mg/dL (ref 0.0–40.0)

## 2014-05-09 LAB — MICROALBUMIN / CREATININE URINE RATIO
Creatinine,U: 54.3 mg/dL
Microalb Creat Ratio: 0.6 mg/g (ref 0.0–30.0)
Microalb, Ur: 0.3 mg/dL (ref 0.0–1.9)

## 2014-05-09 LAB — HM DIABETES FOOT EXAM: HM Diabetic Foot Exam: NORMAL

## 2014-05-09 LAB — HEMOGLOBIN A1C: HEMOGLOBIN A1C: 8.8 % — AB (ref 4.6–6.5)

## 2014-05-09 MED ORDER — METFORMIN HCL 500 MG PO TABS: ORAL_TABLET | ORAL | Status: DC

## 2014-05-09 MED ORDER — SIMVASTATIN 80 MG PO TABS: ORAL_TABLET | ORAL | Status: DC

## 2014-05-09 NOTE — Patient Instructions (Signed)
Schedule physical exam for later this year. We will call you with lab work that was drawn today

## 2014-05-09 NOTE — Progress Notes (Signed)
Pre visit review using our clinic review tool, if applicable. No additional management support is needed unless otherwise documented below in the visit note. 

## 2014-05-09 NOTE — Progress Notes (Signed)
   Subjective:    Patient ID: Adam [name], male    DOB: 10/11/1951, 63 y.o.   MRN: [id number]  HPI Patient seen for medical followup. He has not had insurance until this year and has had very poor compliance with followup. Type 2 diabetes, ongoing nicotine use, dyslipidemia, erectile dysfunction. He takes simvastatin, metformin, and aspirin. No claudication symptoms. Ongoing erectile dysfunction issues. No chest pains. No cardiac history. No history of pneumonia vaccine. Low motivation to quit smoking. He has not had labs in over 2 years.  Past Medical History  Diagnosis Date  . DIABETES MELLITUS, UNCONTROLLED 09/03/2010  . HYPERLIPIDEMIA 08/27/2010  . PERIPHERAL VASCULAR DISEASE 08/27/2010  . 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 WEIGHT LOSS 08/27/2010   Past Surgical History  Procedure Laterality Date  . Cholecystectomy  1990  . Knee surgery  1995    left  . Back surgery  2003    reports that he has been smoking.  He does not have any smokeless tobacco history on file. He reports that he drinks alcohol. His drug history is not on file. family history includes Cancer in his paternal grandfather; Heart disease in his father. No Known Allergies    Review of Systems  Constitutional: Negative for fatigue.  Eyes: Negative for visual disturbance.  Respiratory: Negative for cough, chest tightness and shortness of breath.   Cardiovascular: Negative for chest pain, palpitations and leg swelling.  Neurological: Negative for dizziness, syncope, weakness, light-headedness and headaches.       Objective:   Physical Exam  Constitutional: He is oriented to person, place, and time. He appears well-developed and well-nourished.  HENT:  Right Ear: External ear normal.  Left Ear: External ear normal.  Mouth/Throat: Oropharynx is clear and moist.  Eyes: Pupils are equal, round, and reactive to light.  Neck: Neck supple. No thyromegaly present.  Cardiovascular: Normal rate and regular  rhythm.   Pulmonary/Chest: Effort normal and breath sounds normal. No respiratory distress. He has no wheezes. He has no rales.  Musculoskeletal: He exhibits no edema.  Feet are warm to touch with 1+ dorsalis pedis pulses.  Neurological: He is alert and oriented to person, place, and time.          Assessment & Plan:  #1 type 2 diabetes. unknown level of control. Check A1c. Schedule eye exam #2 hyperlipidemia. Check lipid and hepatic panel. Refill simvastatin for one year #3 ongoing nicotine use. Discussed counseling for smoking cessation but his motivation is low #4 erectile dysfunction. He has not had good response with Viagra or Cialis. We offered a urology referral he is not interested at this time #5 health maintenance. Prevnar 13 given. Schedule Medicare wellness exam

## 2014-05-10 ENCOUNTER — Other Ambulatory Visit: Payer: Self-pay

## 2014-05-10 ENCOUNTER — Telehealth: Payer: Self-pay | Admitting: Family Medicine

## 2014-05-10 MED ORDER — GLIMEPIRIDE 2 MG PO TABS: 2.0000 mg | ORAL_TABLET | Freq: Every day | ORAL | Status: DC

## 2014-05-10 MED ORDER — METFORMIN HCL 500 MG PO TABS: ORAL_TABLET | ORAL | Status: DC

## 2014-05-10 NOTE — Telephone Encounter (Signed)
Relevant patient education mailed to patient.  

## 2014-11-18 ENCOUNTER — Encounter (HOSPITAL_BASED_OUTPATIENT_CLINIC_OR_DEPARTMENT_OTHER): Payer: Self-pay | Admitting: Emergency Medicine

## 2014-11-18 ENCOUNTER — Emergency Department (HOSPITAL_BASED_OUTPATIENT_CLINIC_OR_DEPARTMENT_OTHER)
Admission: EM | Admit: 2014-11-18 | Discharge: 2014-11-18 | Disposition: A | Discharge status: Home / Self Care | Payer: Medicare Other | Attending: Emergency Medicine | Admitting: Emergency Medicine

## 2014-11-18 ENCOUNTER — Emergency Department (HOSPITAL_BASED_OUTPATIENT_CLINIC_OR_DEPARTMENT_OTHER): Payer: Medicare Other

## 2014-11-18 DIAGNOSIS — S52502A Unspecified fracture of the lower end of left radius, initial encounter for closed fracture: Secondary | ICD-10-CM | POA: Diagnosis not present

## 2014-11-18 DIAGNOSIS — Z7982 Long term (current) use of aspirin: Secondary | ICD-10-CM | POA: Insufficient documentation

## 2014-11-18 DIAGNOSIS — T148XXA Other injury of unspecified body region, initial encounter: Secondary | ICD-10-CM

## 2014-11-18 DIAGNOSIS — S52592A Other fractures of lower end of left radius, initial encounter for closed fracture: Secondary | ICD-10-CM | POA: Insufficient documentation

## 2014-11-18 DIAGNOSIS — E785 Hyperlipidemia, unspecified: Secondary | ICD-10-CM | POA: Insufficient documentation

## 2014-11-18 DIAGNOSIS — S52612A Displaced fracture of left ulna styloid process, initial encounter for closed fracture: Secondary | ICD-10-CM | POA: Diagnosis not present

## 2014-11-18 DIAGNOSIS — Y9389 Activity, other specified: Secondary | ICD-10-CM | POA: Insufficient documentation

## 2014-11-18 DIAGNOSIS — Z8679 Personal history of other diseases of the circulatory system: Secondary | ICD-10-CM | POA: Diagnosis not present

## 2014-11-18 DIAGNOSIS — W108XXA Fall (on) (from) other stairs and steps, initial encounter: Secondary | ICD-10-CM | POA: Insufficient documentation

## 2014-11-18 DIAGNOSIS — Y998 Other external cause status: Secondary | ICD-10-CM | POA: Diagnosis not present

## 2014-11-18 DIAGNOSIS — Z87438 Personal history of other diseases of male genital organs: Secondary | ICD-10-CM | POA: Diagnosis not present

## 2014-11-18 DIAGNOSIS — Z79899 Other long term (current) drug therapy: Secondary | ICD-10-CM | POA: Diagnosis not present

## 2014-11-18 DIAGNOSIS — E119 Type 2 diabetes mellitus without complications: Secondary | ICD-10-CM | POA: Diagnosis not present

## 2014-11-18 DIAGNOSIS — Z720 Tobacco use: Secondary | ICD-10-CM | POA: Diagnosis not present

## 2014-11-18 DIAGNOSIS — Y9289 Other specified places as the place of occurrence of the external cause: Secondary | ICD-10-CM | POA: Insufficient documentation

## 2014-11-18 DIAGNOSIS — W19XXXA Unspecified fall, initial encounter: Secondary | ICD-10-CM

## 2014-11-18 DIAGNOSIS — S6992XA Unspecified injury of left wrist, hand and finger(s), initial encounter: Secondary | ICD-10-CM | POA: Diagnosis present

## 2014-11-18 MED ORDER — ONDANSETRON 4 MG PO TBDP
4.0000 mg | ORAL_TABLET | Freq: Once | ORAL | Status: AC
  Administered 2014-11-18: 4 mg via ORAL
  Filled 2014-11-18: qty 1

## 2014-11-18 MED ORDER — HYDROMORPHONE HCL 1 MG/ML IJ SOLN
2.0000 mg | Freq: Once | INTRAMUSCULAR | Status: AC
  Administered 2014-11-18: 2 mg via INTRAMUSCULAR
  Filled 2014-11-18: qty 2

## 2014-11-18 MED ORDER — LIDOCAINE HCL (PF) 1 % IJ SOLN
30.0000 mL | Freq: Once | INTRAMUSCULAR | Status: AC
  Administered 2014-11-18: 10 mL via INTRADERMAL

## 2014-11-18 MED ORDER — LIDOCAINE HCL (PF) 1 % IJ SOLN
INTRAMUSCULAR | Status: AC
  Filled 2014-11-18: qty 30

## 2014-11-18 MED ORDER — OXYCODONE-ACETAMINOPHEN 5-325 MG PO TABS: 1.0000 | ORAL_TABLET | Freq: Four times a day (QID) | ORAL | Status: DC | PRN

## 2014-11-18 NOTE — Discharge Instructions (Signed)
Cast or Splint Care °Casts and splints support injured limbs and keep bones from moving while they heal.  °HOME CARE °· Keep the cast or splint uncovered during the drying period. °¨ A plaster cast can take 24 to 48 hours to dry. °¨ A fiberglass cast will dry in less than 1 hour. °· Do not rest the cast on anything harder than a pillow for 24 hours. °· Do not put weight on your injured limb. Do not put pressure on the cast. Wait for your doctor's approval. °· Keep the cast or splint dry. °¨ Cover the cast or splint with a plastic bag during baths or wet weather. °¨ If you have a cast over your chest and belly (trunk), take sponge baths until the cast is taken off. °¨ If your cast gets wet, dry it with a towel or blow dryer. Use the cool setting on the blow dryer. °· Keep your cast or splint clean. Wash a dirty cast with a damp cloth. °· Do not put any objects under your cast or splint. °· Do not scratch the skin under the cast with an object. If itching is a problem, use a blow dryer on a cool setting over the itchy area. °· Do not trim or cut your cast. °· Do not take out the padding from inside your cast. °· Exercise your joints near the cast as told by your doctor. °· Raise (elevate) your injured limb on 1 or 2 pillows for the first 1 to 3 days. °GET HELP IF: °· Your cast or splint cracks. °· Your cast or splint is too tight or too loose. °· You itch badly under the cast. °· Your cast gets wet or has a soft spot. °· You have a bad smell coming from the cast. °· You get an object stuck under the cast. °· Your skin around the cast becomes red or sore. °· You have new or more pain after the cast is put on. °GET HELP RIGHT AWAY IF: °· You have fluid leaking through the cast. °· You cannot move your fingers or toes. °· Your fingers or toes turn blue or [place] or are cool, painful, or puffy (swollen). °· You have tingling or lose feeling (numbness) around the injured area. °· You have bad pain or pressure under the  cast. °· You have trouble breathing or have shortness of breath. °· You have chest pain. °Document Released: 02/10/2011 Document Revised: 06/13/2013 Document Reviewed: 04/19/2013 °Exit[employer]® Patient Information ©2015 [employer]. This information is not intended to replace advice given to you by your health care provider. Make sure you discuss any questions you have with your health care provider. ° °

## 2014-11-18 NOTE — ED Notes (Signed)
Portable Xray ordered. Radiology called.

## 2014-11-18 NOTE — ED Notes (Signed)
MD at bedside. 

## 2014-11-18 NOTE — ED Provider Notes (Addendum)
CSN: [id number]     Arrival date & time 11/18/14  0905 History   First MD Initiated Contact with Patient 11/18/14 0913     Chief Complaint  Patient presents with  . Wrist Injury     (Consider location/radiation/quality/duration/timing/severity/associated sxs/prior Treatment) Patient is a 64 y.o. male presenting with wrist injury. The history is provided by the patient.  Wrist Injury Location:  Wrist Time since incident:  1 hour Injury: yes   Mechanism of injury: fall   Fall:    Fall occurred:  Down stairs (Was going down the stairs and slipped on ice and fell down one stair landing on an outstretched arm on concrete)   Impact surface:  Concrete   Point of impact:  Outstretched arms Wrist location:  L wrist Pain details:    Quality:  Shooting, throbbing and sharp   Radiates to:  Does not radiate   Severity:  Severe   Onset quality:  Sudden   Timing:  Constant   Progression:  Worsening Chronicity:  New Handedness:  Right-handed Dislocation: no   Foreign body present:  No foreign bodies Prior injury to area:  No Relieved by:  None tried Worsened by:  Movement Ineffective treatments:  None tried Associated symptoms: decreased range of motion and swelling   Associated symptoms: no muscle weakness and no numbness   Associated symptoms comment:  No head injury or LOC. Able to walk without pain in the legs Risk factors: no frequent fractures     Past Medical History  Diagnosis Date  . DIABETES MELLITUS, UNCONTROLLED 09/03/2010  . HYPERLIPIDEMIA 08/27/2010  . PERIPHERAL VASCULAR DISEASE 08/27/2010  . 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 WEIGHT LOSS 08/27/2010   Past Surgical History  Procedure Laterality Date  . Cholecystectomy  1990  . Knee surgery  1995    left  . Back surgery  2003   Family History  Problem Relation Age of Onset  . Heart disease Father   . Cancer Paternal Grandfather     lung   History  Substance Use Topics  . Smoking status: Current Every  Day Smoker -- 2.00 packs/day for 50 years  . Smokeless tobacco: Not on file  . Alcohol Use: Yes     Comment: occasional    Review of Systems  All other systems reviewed and are negative.     Allergies  Review of patient's allergies indicates no known allergies.  Home Medications   Prior to Admission medications   Medication Sig Start Date End Date Taking? Authorizing Provider  aspirin 325 MG tablet Take 325 mg by mouth daily.      Historical Provider, MD  Blood Glucose Monitoring Suppl (ONE TOUCH ULTRA SYSTEM KIT) W/DEVICE KIT 1 kit by Does not apply route once. Disp 09/03/10     Historical Provider, MD  glimepiride (AMARYL) 2 MG tablet Take 1 tablet (2 mg total) by mouth daily with breakfast. 05/10/14   Eulas Post, MD  glucose blood (ONE TOUCH ULTRA TEST) test strip Use as instructed 3-4 times a week     Historical Provider, MD  metFORMIN (GLUCOPHAGE) 500 MG tablet Take 2(Two)  tablets by mouth 2 (two) times daily. 05/10/14   Eulas Post, MD  ONE TOUCH LANCETS MISC Use 3-4 times as week as directed     Historical Provider, MD  simvastatin (ZOCOR) 80 MG tablet Take one half tablet daily 05/09/14   Eulas Post, MD   BP 163/76 mmHg  Pulse 94  Temp(Src)  98.3 F (36.8 C) (Oral)  Resp 16  Ht 6' (1.829 m)  Wt 170 lb (77.111 kg)  BMI 23.05 kg/m2  SpO2 99% Physical Exam  Constitutional: He is oriented to person, place, and time. He appears well-developed and well-nourished. No distress.  HENT:  Head: Normocephalic and atraumatic.  Eyes: EOM are normal. Pupils are equal, round, and reactive to light.  Cardiovascular: Normal rate.   Pulmonary/Chest: Effort normal.  Musculoskeletal:       Left wrist: He exhibits decreased range of motion, tenderness, bony tenderness, swelling and deformity.  Neurological: He is alert and oriented to person, place, and time.  Skin: Skin is warm and dry.  Psychiatric: He has a normal mood and affect. His behavior is normal.  Nursing  note and vitals reviewed.   ED Course  Procedures (including critical care time) Labs Review Labs Reviewed - No data to display  Imaging Review Dg Wrist 2 Views Left  11/18/2014   CLINICAL DATA:  Post reduction  EXAM: LEFT WRIST - 2 VIEW  COMPARISON:  11/18/2014 at 9:35  FINDINGS: Two views obtained portably demonstrate improved alignment and position of the distal radius fracture, now in anatomic alignment with no significant displacement. There is mild fracture fragment separation at the ulnar styloid.  IMPRESSION: Improved alignment and position of the fractures of the radius and ulnar styloid.   Electronically Signed   By: Andreas Newport M.D.   On: 11/18/2014 10:52   Dg Wrist Complete Left  11/18/2014   CLINICAL DATA:  Fall on stairs landing on left hand. Left wrist pain. Deformity of the left wrist.  EXAM: LEFT WRIST - COMPLETE 3+ VIEW  COMPARISON:  None.  FINDINGS: A distal left radial fracture demonstrates 6-7 mm of dorsal displacement and dorsal tilt. The fracture likely extends to the articular surface. An ulnar styloid fracture is also present. The carpal bones are intact. Extensive soft tissue swelling is present. Vascular calcifications are evident.  IMPRESSION: 1. Mild displacement and dorsal angulation of the distal radial fracture with probable intra-articular involvement. 2. Minimally displaced ulnar styloid fracture.   Electronically Signed   By: Lawrence Santiago M.D.   On: 11/18/2014 09:27     EKG Interpretation None      MDM   Final diagnoses:  Fall  Fracture  Distal radius fracture, left, closed, initial encounter  Fracture of ulnar styloid, left, closed, initial encounter    Patient with mechanical fall on an outstretched hand with obvious deformity of the left wrist. Neurovascularly intact with 2+ radial pulse and normal sensation. No other injury from fall. Patient is not on anticoagulation. He was given pain control and x-rays pending  9:55 AM  Imaging shows  mild displacement and dorsal angulation of the distal radius fracture. Spoke with Dr. Barbaraann Barthel who will reduce the fracture with fingertrap hematoma block.  11:11 AM Fracture reduced by Dr Barbaraann Barthel and pt will f/u later this week.  Blanchie Dessert, MD 11/18/14 1111  Blanchie Dessert, MD 11/18/14 1113

## 2014-11-18 NOTE — ED Notes (Signed)
Pt fell on ice.  Injured left wrist.  Deformity noted.

## 2014-11-18 NOTE — ED Notes (Signed)
Pt placed in Finger Traction. 5lbs.

## 2014-11-22 ENCOUNTER — Ambulatory Visit (HOSPITAL_BASED_OUTPATIENT_CLINIC_OR_DEPARTMENT_OTHER)
Admission: EM | Admit: 2014-11-22 | Discharge: 2014-11-22 | Disposition: A | Discharge status: Home / Self Care | Payer: Medicare Other | Attending: Emergency Medicine | Admitting: Emergency Medicine

## 2014-11-22 ENCOUNTER — Ambulatory Visit (INDEPENDENT_AMBULATORY_CARE_PROVIDER_SITE_OTHER): Payer: Medicare Other | Admitting: Family Medicine

## 2014-11-22 ENCOUNTER — Ambulatory Visit (HOSPITAL_BASED_OUTPATIENT_CLINIC_OR_DEPARTMENT_OTHER)
Admission: EM | Admit: 2014-11-22 | Discharge: 2014-11-22 | Disposition: A | Discharge status: Home / Self Care | Payer: Medicare Other | Source: Home / Self Care | Attending: Family Medicine | Admitting: Family Medicine

## 2014-11-22 ENCOUNTER — Encounter: Payer: Self-pay | Admitting: Family Medicine

## 2014-11-22 VITALS — BP 143/84 | HR 74 | Ht 72.0 in | Wt 170.0 lb

## 2014-11-22 DIAGNOSIS — S52612D Displaced fracture of left ulna styloid process, subsequent encounter for closed fracture with routine healing: Secondary | ICD-10-CM | POA: Insufficient documentation

## 2014-11-22 DIAGNOSIS — W000XXD Fall on same level due to ice and snow, subsequent encounter: Secondary | ICD-10-CM | POA: Diagnosis not present

## 2014-11-22 DIAGNOSIS — S52592D Other fractures of lower end of left radius, subsequent encounter for closed fracture with routine healing: Secondary | ICD-10-CM | POA: Diagnosis not present

## 2014-11-22 DIAGNOSIS — S52502A Unspecified fracture of the lower end of left radius, initial encounter for closed fracture: Secondary | ICD-10-CM

## 2014-11-22 DIAGNOSIS — M25532 Pain in left wrist: Secondary | ICD-10-CM | POA: Diagnosis not present

## 2014-11-22 DIAGNOSIS — S52602A Unspecified fracture of lower end of left ulna, initial encounter for closed fracture: Secondary | ICD-10-CM | POA: Diagnosis not present

## 2014-11-22 DIAGNOSIS — S52612A Displaced fracture of left ulna styloid process, initial encounter for closed fracture: Secondary | ICD-10-CM | POA: Diagnosis not present

## 2014-11-22 MED ORDER — OXYCODONE-ACETAMINOPHEN 5-325 MG PO TABS: 1.0000 | ORAL_TABLET | Freq: Four times a day (QID) | ORAL | Status: DC | PRN

## 2014-11-22 NOTE — Patient Instructions (Signed)
Your fracture looks good so far. Take pain medication as needed. Follow up with me in 1 week - we will repeat your x-rays in the splint then. If this still looks good we will switch you to a cast.

## 2014-11-26 DIAGNOSIS — S52602A Unspecified fracture of lower end of left ulna, initial encounter for closed fracture: Secondary | ICD-10-CM

## 2014-11-26 DIAGNOSIS — S52502A Unspecified fracture of the lower end of left radius, initial encounter for closed fracture: Secondary | ICD-10-CM | POA: Insufficient documentation

## 2014-11-26 NOTE — Progress Notes (Signed)
PCP: Eulas Post, MD  Subjective:   HPI: Patient is a 64 y.o. male here for left wrist injury.  On 1/25 patient was coming down stairs, slipped on ice and sustained [place] injury to left wrist. Radiographs showed a displaced distal radius fracture with dorsal angulation along with a small ulnar styloid fracture. I performed a closed reduction in the ED which was successful - patient placed in posterior and sugar tong splints and instructed to follow-up today. Has been elevating, taking percocet. + swelling. Doing well in splints. Right handed.  Past Medical History  Diagnosis Date  . DIABETES MELLITUS, UNCONTROLLED 09/03/2010  . HYPERLIPIDEMIA 08/27/2010  . PERIPHERAL VASCULAR DISEASE 08/27/2010  . 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 WEIGHT LOSS 08/27/2010    Current Outpatient Prescriptions on File Prior to Visit  Medication Sig Dispense Refill  . aspirin 325 MG tablet Take 325 mg by mouth daily.      . Blood Glucose Monitoring Suppl (ONE TOUCH ULTRA SYSTEM KIT) W/DEVICE KIT 1 kit by Does not apply route once. Disp 09/03/10     . glimepiride (AMARYL) 2 MG tablet Take 1 tablet (2 mg total) by mouth daily with breakfast. 30 tablet 5  . glucose blood (ONE TOUCH ULTRA TEST) test strip Use as instructed 3-4 times a week     . metFORMIN (GLUCOPHAGE) 500 MG tablet Take 2(Two)  tablets by mouth 2 (two) times daily. 360 tablet 2  . ONE TOUCH LANCETS MISC Use 3-4 times as week as directed     . simvastatin (ZOCOR) 80 MG tablet Take one half tablet daily 45 tablet 3   No current facility-administered medications on file prior to visit.    Past Surgical History  Procedure Laterality Date  . Cholecystectomy  1990  . Knee surgery  1995    left  . Back surgery  2003    No Known Allergies  History   Social History  . Marital Status: Single    Spouse Name: N/A    Number of Children: N/A  . Years of Education: N/A   Occupational History  . Not on file.   Social History  Main Topics  . Smoking status: Current Every Day Smoker -- 2.00 packs/day for 50 years  . Smokeless tobacco: Not on file  . Alcohol Use: 0.0 oz/week    0 Not specified per week     Comment: occasional  . Drug Use: Not on file  . Sexual Activity: Not on file   Other Topics Concern  . Not on file   Social History Narrative    Family History  Problem Relation Age of Onset  . Heart disease Father   . Cancer Paternal Grandfather     lung    BP 143/84 mmHg  Pulse 74  Ht 6' (1.829 m)  Wt 170 lb (77.111 kg)  BMI 23.05 kg/m2  Review of Systems: See HPI above.    Objective:  Physical Exam:  Gen: NAD  Left wrist: Splint kept in place due to fracture that required closed reduction. Minimal skin irritation around thumb.  No erythema. Sensation intact all digits with < 2 sec cap refill.    Assessment & Plan:  1. Left distal radius and ulna fractures - s/p closed reduction.  Alignment maintained since closed reduction on 1/25.  Will keep splints on and f/u in 1 week to repeat radiographs again with splints in place.  If still holding position plan to switch to a cast at that time.  Continue percocet as needed for pain.

## 2014-11-26 NOTE — Assessment & Plan Note (Signed)
s/p closed reduction.  Alignment maintained since closed reduction on 1/25.  Will keep splints on and f/u in 1 week to repeat radiographs again with splints in place.  If still holding position plan to switch to a cast at that time.  Continue percocet as needed for pain.

## 2014-11-29 ENCOUNTER — Ambulatory Visit (HOSPITAL_BASED_OUTPATIENT_CLINIC_OR_DEPARTMENT_OTHER)
Admission: RE | Admit: 2014-11-29 | Discharge: 2014-11-29 | Disposition: A | Discharge status: Home / Self Care | Payer: Medicare Other | Source: Ambulatory Visit | Attending: Family Medicine | Admitting: Family Medicine

## 2014-11-29 ENCOUNTER — Encounter: Payer: Self-pay | Admitting: Family Medicine

## 2014-11-29 ENCOUNTER — Ambulatory Visit (INDEPENDENT_AMBULATORY_CARE_PROVIDER_SITE_OTHER): Payer: Medicare Other | Admitting: Family Medicine

## 2014-11-29 VITALS — BP 164/82 | HR 80 | Ht 72.0 in | Wt 170.0 lb

## 2014-11-29 DIAGNOSIS — S52602S Unspecified fracture of lower end of left ulna, sequela: Secondary | ICD-10-CM

## 2014-11-29 DIAGNOSIS — S52512A Displaced fracture of left radial styloid process, initial encounter for closed fracture: Secondary | ICD-10-CM | POA: Diagnosis not present

## 2014-11-29 DIAGNOSIS — S52502S Unspecified fracture of the lower end of left radius, sequela: Secondary | ICD-10-CM

## 2014-11-29 DIAGNOSIS — X58XXXA Exposure to other specified factors, initial encounter: Secondary | ICD-10-CM | POA: Diagnosis not present

## 2014-11-29 DIAGNOSIS — S52612A Displaced fracture of left ulna styloid process, initial encounter for closed fracture: Secondary | ICD-10-CM | POA: Diagnosis not present

## 2014-11-29 DIAGNOSIS — S59292A Other physeal fracture of lower end of radius, left arm, initial encounter for closed fracture: Secondary | ICD-10-CM | POA: Diagnosis not present

## 2014-12-02 NOTE — Progress Notes (Signed)
PCP: Eulas Post, MD  Subjective:   HPI: Patient is a 64 y.o. male here for left wrist injury.  1/29: On 1/25 patient was coming down stairs, slipped on ice and sustained [place] injury to left wrist. Radiographs showed a displaced distal radius fracture with dorsal angulation along with a small ulnar styloid fracture. I performed a closed reduction in the ED which was successful - patient placed in posterior and sugar tong splints and instructed to follow-up today. Has been elevating, taking percocet. + swelling. Doing well in splints. Right handed.  2/5: Patient reports he is doing well. Aching and throbbing in wrist. Tolerating the wrist splint. Some swelling in fingers. Taking percocet as needed.  Past Medical History  Diagnosis Date  . DIABETES MELLITUS, UNCONTROLLED 09/03/2010  . HYPERLIPIDEMIA 08/27/2010  . PERIPHERAL VASCULAR DISEASE 08/27/2010  . 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 WEIGHT LOSS 08/27/2010    Current Outpatient Prescriptions on File Prior to Visit  Medication Sig Dispense Refill  . aspirin 325 MG tablet Take 325 mg by mouth daily.      . Blood Glucose Monitoring Suppl (ONE TOUCH ULTRA SYSTEM KIT) W/DEVICE KIT 1 kit by Does not apply route once. Disp 09/03/10     . glimepiride (AMARYL) 2 MG tablet Take 1 tablet (2 mg total) by mouth daily with breakfast. 30 tablet 5  . glucose blood (ONE TOUCH ULTRA TEST) test strip Use as instructed 3-4 times a week     . metFORMIN (GLUCOPHAGE) 500 MG tablet Take 2(Two)  tablets by mouth 2 (two) times daily. 360 tablet 2  . ONE TOUCH LANCETS MISC Use 3-4 times as week as directed     . oxyCODONE-acetaminophen (PERCOCET/ROXICET) 5-325 MG per tablet Take 1 tablet by mouth every 6 (six) hours as needed for severe pain. 60 tablet 0  . simvastatin (ZOCOR) 80 MG tablet Take one half tablet daily 45 tablet 3   No current facility-administered medications on file prior to visit.    Past Surgical History  Procedure  Laterality Date  . Cholecystectomy  1990  . Knee surgery  1995    left  . Back surgery  2003    No Known Allergies  History   Social History  . Marital Status: Single    Spouse Name: N/A    Number of Children: N/A  . Years of Education: N/A   Occupational History  . Not on file.   Social History Main Topics  . Smoking status: Current Every Day Smoker -- 2.00 packs/day for 50 years  . Smokeless tobacco: Not on file  . Alcohol Use: 0.0 oz/week    0 Not specified per week     Comment: occasional  . Drug Use: Not on file  . Sexual Activity: Not on file   Other Topics Concern  . Not on file   Social History Narrative    Family History  Problem Relation Age of Onset  . Heart disease Father   . Cancer Paternal Grandfather     lung    BP 164/82 mmHg  Pulse 80  Ht 6' (1.829 m)  Wt 170 lb (77.111 kg)  BMI 23.05 kg/m2  Review of Systems: See HPI above.    Objective:  Physical Exam:  Gen: NAD  Left wrist: Splint removed only to change to a cast. Mild swelling wrist to hand dorsally. Mild pressure applied after cast placed dorsally. Minimal skin irritation around thumb.  No erythema. Sensation intact all digits with < 2  sec cap refill.    Assessment & Plan:  1. Left distal radius and ulna fractures - s/p closed reduction.  Radiographs with preserved alignment in cast, within limits to continue with conservative treatment and he should do well with this.  F/u in 1 week to repeat radiographs through cast.  Continue percocet.

## 2014-12-04 NOTE — Consult Note (Signed)
I was called to emergency department to evaluate Mr. Apple who slipped on the ice and sustained a FOOS[place] injury to left wrist.  This resulted in a dorsally angulated, displaced distal radius fracture and a nondisplaced ulnar styloid fracture.  After reviewing his radiographs, discussing the case with the patient, reviewing risks and benefits (additional procedures, possibility of surgical intervention, possibility of repeat closed reduction, increased bleeding) we decided to go ahead with closed reduction.  Patient was placed in finger traps with 5 pounds of distraction and EDP did a hematoma block.  Closed reduction performed and was successful after review of repeat bedside radiographs.  I then placed both posterior and sugar tong splints on patient and instructed him to follow up in the office in 3 days - will repeat radiographs with splints still in place at that time.  Can discharge with sling and pain medication.  Call me with any questions or concerns in the meantime.

## 2014-12-06 ENCOUNTER — Ambulatory Visit (INDEPENDENT_AMBULATORY_CARE_PROVIDER_SITE_OTHER): Payer: Medicare Other | Admitting: Family Medicine

## 2014-12-06 ENCOUNTER — Ambulatory Visit (HOSPITAL_BASED_OUTPATIENT_CLINIC_OR_DEPARTMENT_OTHER)
Admission: RE | Admit: 2014-12-06 | Discharge: 2014-12-06 | Disposition: A | Discharge status: Home / Self Care | Payer: Medicare Other | Source: Ambulatory Visit | Attending: Family Medicine | Admitting: Family Medicine

## 2014-12-06 ENCOUNTER — Encounter: Payer: Self-pay | Admitting: Family Medicine

## 2014-12-06 VITALS — BP 160/83 | HR 76 | Ht 72.0 in | Wt 170.0 lb

## 2014-12-06 DIAGNOSIS — X58XXXD Exposure to other specified factors, subsequent encounter: Secondary | ICD-10-CM | POA: Diagnosis not present

## 2014-12-06 DIAGNOSIS — S52602S Unspecified fracture of lower end of left ulna, sequela: Secondary | ICD-10-CM

## 2014-12-06 DIAGNOSIS — S62102D Fracture of unspecified carpal bone, left wrist, subsequent encounter for fracture with routine healing: Secondary | ICD-10-CM | POA: Diagnosis present

## 2014-12-06 DIAGNOSIS — S52502S Unspecified fracture of the lower end of left radius, sequela: Secondary | ICD-10-CM

## 2014-12-06 DIAGNOSIS — S52502D Unspecified fracture of the lower end of left radius, subsequent encounter for closed fracture with routine healing: Secondary | ICD-10-CM | POA: Insufficient documentation

## 2014-12-06 DIAGNOSIS — S52612D Displaced fracture of left ulna styloid process, subsequent encounter for closed fracture with routine healing: Secondary | ICD-10-CM | POA: Diagnosis not present

## 2014-12-06 DIAGNOSIS — S52612A Displaced fracture of left ulna styloid process, initial encounter for closed fracture: Secondary | ICD-10-CM | POA: Diagnosis not present

## 2014-12-06 DIAGNOSIS — S52502A Unspecified fracture of the lower end of left radius, initial encounter for closed fracture: Secondary | ICD-10-CM | POA: Diagnosis not present

## 2014-12-06 DIAGNOSIS — M25532 Pain in left wrist: Secondary | ICD-10-CM

## 2014-12-06 MED ORDER — OXYCODONE-ACETAMINOPHEN 5-325 MG PO TABS: 1.0000 | ORAL_TABLET | Freq: Four times a day (QID) | ORAL | Status: DC | PRN

## 2014-12-06 NOTE — Patient Instructions (Signed)
Follow up with me in 2 weeks. We will get x-rays before taking the cast off again. Then plan to switch to a short arm cast (one below the elbow).

## 2014-12-10 NOTE — Assessment & Plan Note (Signed)
s/p closed reduction.  Radiographs with preserved alignment in cast and some early healing.  F/u in 2 weeks - will repeat x-rays prior to removing cast.  Plan at that time to switch to short arm cast for likely another 2 weeks.

## 2014-12-10 NOTE — Progress Notes (Signed)
PCP: Eulas Post, MD  Subjective:   HPI: Patient is a 64 y.o. male here for left wrist injury.  1/29: On 1/25 patient was coming down stairs, slipped on ice and sustained [place] injury to left wrist. Radiographs showed a displaced distal radius fracture with dorsal angulation along with a small ulnar styloid fracture. I performed a closed reduction in the ED which was successful - patient placed in posterior and sugar tong splints and instructed to follow-up today. Has been elevating, taking percocet. + swelling. Doing well in splints. Right handed.  2/5: Patient reports he is doing well. Aching and throbbing in wrist. Tolerating the wrist splint. Some swelling in fingers. Taking percocet as needed.  2/12: Patient reports he is doing well. Still having pain, some swelling into fingers. Pain level 7/10 at worst. Tolerating cast.  Past Medical History  Diagnosis Date  . DIABETES MELLITUS, UNCONTROLLED 09/03/2010  . HYPERLIPIDEMIA 08/27/2010  . PERIPHERAL VASCULAR DISEASE 08/27/2010  . 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 WEIGHT LOSS 08/27/2010    Current Outpatient Prescriptions on File Prior to Visit  Medication Sig Dispense Refill  . aspirin 325 MG tablet Take 325 mg by mouth daily.      . Blood Glucose Monitoring Suppl (ONE TOUCH ULTRA SYSTEM KIT) W/DEVICE KIT 1 kit by Does not apply route once. Disp 09/03/10     . glimepiride (AMARYL) 2 MG tablet Take 1 tablet (2 mg total) by mouth daily with breakfast. 30 tablet 5  . glucose blood (ONE TOUCH ULTRA TEST) test strip Use as instructed 3-4 times a week     . metFORMIN (GLUCOPHAGE) 500 MG tablet Take 2(Two)  tablets by mouth 2 (two) times daily. 360 tablet 2  . ONE TOUCH LANCETS MISC Use 3-4 times as week as directed     . simvastatin (ZOCOR) 80 MG tablet Take one half tablet daily 45 tablet 3   No current facility-administered medications on file prior to visit.    Past Surgical History  Procedure Laterality  Date  . Cholecystectomy  1990  . Knee surgery  1995    left  . Back surgery  2003    No Known Allergies  History   Social History  . Marital Status: Single    Spouse Name: N/A  . Number of Children: N/A  . Years of Education: N/A   Occupational History  . Not on file.   Social History Main Topics  . Smoking status: Current Every Day Smoker -- 2.00 packs/day for 50 years  . Smokeless tobacco: Not on file  . Alcohol Use: 0.0 oz/week    0 Standard drinks or equivalent per week     Comment: occasional  . Drug Use: Not on file  . Sexual Activity: Not on file   Other Topics Concern  . Not on file   Social History Narrative    Family History  Problem Relation Age of Onset  . Heart disease Father   . Cancer Paternal Grandfather     lung    BP 160/83 mmHg  Pulse 76  Ht 6' (1.829 m)  Wt 170 lb (77.111 kg)  BMI 23.05 kg/m2  Review of Systems: See HPI above.    Objective:  Physical Exam:  Gen: NAD  Left wrist: Long arm cast in place - not removed today. Mild swelling digits.Marland Kitchen FROM digits with 5/5 strength. No skin irritation around thumb.  No erythema. Sensation intact all digits with < 2 sec cap refill.  Assessment & Plan:  1. Left distal radius and ulna fractures - s/p closed reduction.  Radiographs with preserved alignment in cast and some early healing.  F/u in 2 weeks - will repeat x-rays prior to removing cast.  Plan at that time to switch to short arm cast for likely another 2 weeks.

## 2014-12-20 ENCOUNTER — Encounter: Payer: Self-pay | Admitting: Family Medicine

## 2014-12-20 ENCOUNTER — Ambulatory Visit (INDEPENDENT_AMBULATORY_CARE_PROVIDER_SITE_OTHER): Payer: Medicare Other | Admitting: Family Medicine

## 2014-12-20 ENCOUNTER — Ambulatory Visit (HOSPITAL_BASED_OUTPATIENT_CLINIC_OR_DEPARTMENT_OTHER)
Admission: RE | Admit: 2014-12-20 | Discharge: 2014-12-20 | Disposition: A | Discharge status: Home / Self Care | Payer: Medicare Other | Source: Ambulatory Visit | Attending: Family Medicine | Admitting: Family Medicine

## 2014-12-20 ENCOUNTER — Encounter (INDEPENDENT_AMBULATORY_CARE_PROVIDER_SITE_OTHER): Payer: Self-pay

## 2014-12-20 VITALS — BP 152/82 | HR 73 | Ht 72.0 in | Wt 170.0 lb

## 2014-12-20 DIAGNOSIS — S52602D Unspecified fracture of lower end of left ulna, subsequent encounter for closed fracture with routine healing: Secondary | ICD-10-CM | POA: Insufficient documentation

## 2014-12-20 DIAGNOSIS — W19XXXD Unspecified fall, subsequent encounter: Secondary | ICD-10-CM | POA: Diagnosis not present

## 2014-12-20 DIAGNOSIS — S52502S Unspecified fracture of the lower end of left radius, sequela: Secondary | ICD-10-CM | POA: Diagnosis not present

## 2014-12-20 DIAGNOSIS — S52602S Unspecified fracture of lower end of left ulna, sequela: Secondary | ICD-10-CM

## 2014-12-20 DIAGNOSIS — M25532 Pain in left wrist: Secondary | ICD-10-CM

## 2014-12-20 DIAGNOSIS — S52502D Unspecified fracture of the lower end of left radius, subsequent encounter for closed fracture with routine healing: Secondary | ICD-10-CM | POA: Diagnosis not present

## 2014-12-20 DIAGNOSIS — S52501D Unspecified fracture of the lower end of right radius, subsequent encounter for closed fracture with routine healing: Secondary | ICD-10-CM | POA: Diagnosis not present

## 2014-12-20 MED ORDER — OXYCODONE-ACETAMINOPHEN 5-325 MG PO TABS: 1.0000 | ORAL_TABLET | Freq: Four times a day (QID) | ORAL | Status: DC | PRN

## 2014-12-20 NOTE — Patient Instructions (Signed)
Follow up with me in 2 weeks. We will remove the cast, repeat your x-rays.

## 2014-12-23 NOTE — Assessment & Plan Note (Signed)
s/p closed reduction.  Radiographs show stable alignment with interval healing and callus - transition to short arm cast today and f/u in 2 weeks to remove and repeat radiographs.

## 2014-12-23 NOTE — Progress Notes (Signed)
PCP: Eulas Post, MD  Subjective:   HPI: Patient is a 64 y.o. male here for left wrist injury.  1/29: On 1/25 patient was coming down stairs, slipped on ice and sustained [place] injury to left wrist. Radiographs showed a displaced distal radius fracture with dorsal angulation along with a small ulnar styloid fracture. I performed a closed reduction in the ED which was successful - patient placed in posterior and sugar tong splints and instructed to follow-up today. Has been elevating, taking percocet. + swelling. Doing well in splints. Right handed.  2/5: Patient reports he is doing well. Aching and throbbing in wrist. Tolerating the wrist splint. Some swelling in fingers. Taking percocet as needed.  2/12: Patient reports he is doing well. Still having pain, some swelling into fingers. Pain level 7/10 at worst. Tolerating cast.  2/26: Patient reports he is doing well. Still with pain up to 7/10. Tolerating long arm cast. No other complaints.  Past Medical History  Diagnosis Date  . DIABETES MELLITUS, UNCONTROLLED 09/03/2010  . HYPERLIPIDEMIA 08/27/2010  . PERIPHERAL VASCULAR DISEASE 08/27/2010  . 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 WEIGHT LOSS 08/27/2010    Current Outpatient Prescriptions on File Prior to Visit  Medication Sig Dispense Refill  . aspirin 325 MG tablet Take 325 mg by mouth daily.      . Blood Glucose Monitoring Suppl (ONE TOUCH ULTRA SYSTEM KIT) W/DEVICE KIT 1 kit by Does not apply route once. Disp 09/03/10     . glimepiride (AMARYL) 2 MG tablet Take 1 tablet (2 mg total) by mouth daily with breakfast. 30 tablet 5  . glucose blood (ONE TOUCH ULTRA TEST) test strip Use as instructed 3-4 times a week     . metFORMIN (GLUCOPHAGE) 500 MG tablet Take 2(Two)  tablets by mouth 2 (two) times daily. 360 tablet 2  . ONE TOUCH LANCETS MISC Use 3-4 times as week as directed     . simvastatin (ZOCOR) 80 MG tablet Take one half tablet daily 45 tablet 3    No current facility-administered medications on file prior to visit.    Past Surgical History  Procedure Laterality Date  . Cholecystectomy  1990  . Knee surgery  1995    left  . Back surgery  2003    No Known Allergies  History   Social History  . Marital Status: Single    Spouse Name: N/A  . Number of Children: N/A  . Years of Education: N/A   Occupational History  . Not on file.   Social History Main Topics  . Smoking status: Current Every Day Smoker -- 2.00 packs/day for 50 years  . Smokeless tobacco: Not on file  . Alcohol Use: 0.0 oz/week    0 Standard drinks or equivalent per week     Comment: occasional  . Drug Use: Not on file  . Sexual Activity: Not on file   Other Topics Concern  . Not on file   Social History Narrative    Family History  Problem Relation Age of Onset  . Heart disease Father   . Cancer Paternal Grandfather     lung    BP 152/82 mmHg  Pulse 73  Ht 6' (1.829 m)  Wt 170 lb (77.111 kg)  BMI 23.05 kg/m2  Review of Systems: See HPI above.    Objective:  Physical Exam:  Gen: NAD  Left wrist: Long arm cast removed. Mild swelling digits.Marland Kitchen FROM digits with 5/5 strength. No tenderness distal radius or  ulna. No skin irritation around thumb.  No erythema. Sensation intact all digits with < 2 sec cap refill.    Assessment & Plan:  1. Left distal radius and ulna fractures - s/p closed reduction.  Radiographs show stable alignment with interval healing and callus - transition to short arm cast today and f/u in 2 weeks to remove and repeat radiographs.

## 2015-01-03 ENCOUNTER — Ambulatory Visit (HOSPITAL_BASED_OUTPATIENT_CLINIC_OR_DEPARTMENT_OTHER)
Admission: RE | Admit: 2015-01-03 | Discharge: 2015-01-03 | Disposition: A | Discharge status: Home / Self Care | Payer: Medicare Other | Source: Ambulatory Visit | Attending: Family Medicine | Admitting: Family Medicine

## 2015-01-03 ENCOUNTER — Encounter: Payer: Self-pay | Admitting: Family Medicine

## 2015-01-03 ENCOUNTER — Ambulatory Visit (INDEPENDENT_AMBULATORY_CARE_PROVIDER_SITE_OTHER): Payer: Medicare Other | Admitting: Family Medicine

## 2015-01-03 ENCOUNTER — Encounter (INDEPENDENT_AMBULATORY_CARE_PROVIDER_SITE_OTHER): Payer: Self-pay

## 2015-01-03 VITALS — BP 126/74 | HR 83 | Ht 72.0 in | Wt 170.0 lb

## 2015-01-03 DIAGNOSIS — X58XXXD Exposure to other specified factors, subsequent encounter: Secondary | ICD-10-CM | POA: Insufficient documentation

## 2015-01-03 DIAGNOSIS — S52502S Unspecified fracture of the lower end of left radius, sequela: Secondary | ICD-10-CM

## 2015-01-03 DIAGNOSIS — S52602S Unspecified fracture of lower end of left ulna, sequela: Secondary | ICD-10-CM

## 2015-01-03 DIAGNOSIS — S52592D Other fractures of lower end of left radius, subsequent encounter for closed fracture with routine healing: Secondary | ICD-10-CM | POA: Insufficient documentation

## 2015-01-03 DIAGNOSIS — S52502A Unspecified fracture of the lower end of left radius, initial encounter for closed fracture: Secondary | ICD-10-CM | POA: Diagnosis not present

## 2015-01-03 NOTE — Patient Instructions (Signed)
Clinically this fracture has healed - you no longer have pain at the fracture site. You still have swelling and stiffness. Wear the wrist brace for support but come out of this at least a couple times a day to do up/down and wrist circle exercises. Consider occupational therapy if not improving as expected. Follow up with me in 2-3 weeks. Icing, ibuprofen, tylenol as needed at this point.

## 2015-01-07 NOTE — Assessment & Plan Note (Signed)
s/p closed reduction.  Radiographs show stable alignment with interval healing and callus.  He does not have any pain at fracture sites now.  Will switch to wrist brace for support and f/u in 2-3 weeks.  Consider occupational therapy if he is struggling.

## 2015-01-07 NOTE — Progress Notes (Signed)
PCP: Eulas Post, MD  Subjective:   HPI: Patient is a 64 y.o. male here for left wrist injury.  1/29: On 1/25 patient was coming down stairs, slipped on ice and sustained [place] injury to left wrist. Radiographs showed a displaced distal radius fracture with dorsal angulation along with a small ulnar styloid fracture. I performed a closed reduction in the ED which was successful - patient placed in posterior and sugar tong splints and instructed to follow-up today. Has been elevating, taking percocet. + swelling. Doing well in splints. Right handed.  2/5: Patient reports he is doing well. Aching and throbbing in wrist. Tolerating the wrist splint. Some swelling in fingers. Taking percocet as needed.  2/12: Patient reports he is doing well. Still having pain, some swelling into fingers. Pain level 7/10 at worst. Tolerating cast.  2/26: Patient reports he is doing well. Still with pain up to 7/10. Tolerating long arm cast. No other complaints.  3/11: Patient reports he no longer has pain. Doing well in short arm cast. Not taking any medication currently. No other complaints.  Past Medical History  Diagnosis Date  . DIABETES MELLITUS, UNCONTROLLED 09/03/2010  . HYPERLIPIDEMIA 08/27/2010  . PERIPHERAL VASCULAR DISEASE 08/27/2010  . 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 WEIGHT LOSS 08/27/2010    Current Outpatient Prescriptions on File Prior to Visit  Medication Sig Dispense Refill  . aspirin 325 MG tablet Take 325 mg by mouth daily.      . Blood Glucose Monitoring Suppl (ONE TOUCH ULTRA SYSTEM KIT) W/DEVICE KIT 1 kit by Does not apply route once. Disp 09/03/10     . glimepiride (AMARYL) 2 MG tablet Take 1 tablet (2 mg total) by mouth daily with breakfast. 30 tablet 5  . glucose blood (ONE TOUCH ULTRA TEST) test strip Use as instructed 3-4 times a week     . metFORMIN (GLUCOPHAGE) 500 MG tablet Take 2(Two)  tablets by mouth 2 (two) times daily. 360 tablet 2  .  ONE TOUCH LANCETS MISC Use 3-4 times as week as directed     . oxyCODONE-acetaminophen (PERCOCET/ROXICET) 5-325 MG per tablet Take 1 tablet by mouth every 6 (six) hours as needed for severe pain. 60 tablet 0  . simvastatin (ZOCOR) 80 MG tablet Take one half tablet daily 45 tablet 3   No current facility-administered medications on file prior to visit.    Past Surgical History  Procedure Laterality Date  . Cholecystectomy  1990  . Knee surgery  1995    left  . Back surgery  2003    No Known Allergies  History   Social History  . Marital Status: Single    Spouse Name: N/A  . Number of Children: N/A  . Years of Education: N/A   Occupational History  . Not on file.   Social History Main Topics  . Smoking status: Current Every Day Smoker -- 2.00 packs/day for 50 years  . Smokeless tobacco: Not on file  . Alcohol Use: 0.0 oz/week    0 Standard drinks or equivalent per week     Comment: occasional  . Drug Use: Not on file  . Sexual Activity: Not on file   Other Topics Concern  . Not on file   Social History Narrative    Family History  Problem Relation Age of Onset  . Heart disease Father   . Cancer Paternal Grandfather     lung    BP 126/74 mmHg  Pulse 83  Ht 6' (1.829  m)  Wt 170 lb (77.111 kg)  BMI 23.05 kg/m2  Review of Systems: See HPI above.    Objective:  Physical Exam:  Gen: NAD  Left wrist: Short arm cast removed. Mild swelling of the wrist.  No bruising, other deformity. FROM digits with 5/5 strength. Some pain and mod limitation wrist extension and flexion. No tenderness distal radius or ulna.  No skin irritation around thumb.  No erythema. Sensation intact all digits with < 2 sec cap refill.    Assessment & Plan:  1. Left distal radius and ulna fractures - s/p closed reduction.  Radiographs show stable alignment with interval healing and callus.  He does not have any pain at fracture sites now.  Will switch to wrist brace for support and  f/u in 2-3 weeks.  Consider occupational therapy if he is struggling.

## 2015-01-20 ENCOUNTER — Ambulatory Visit (INDEPENDENT_AMBULATORY_CARE_PROVIDER_SITE_OTHER): Payer: Medicare Other | Admitting: Family Medicine

## 2015-01-20 ENCOUNTER — Encounter: Payer: Self-pay | Admitting: Family Medicine

## 2015-01-20 VITALS — BP 143/84 | HR 82 | Ht 72.0 in | Wt 170.0 lb

## 2015-01-20 DIAGNOSIS — S52502S Unspecified fracture of the lower end of left radius, sequela: Secondary | ICD-10-CM | POA: Diagnosis not present

## 2015-01-20 DIAGNOSIS — S52602S Unspecified fracture of lower end of left ulna, sequela: Secondary | ICD-10-CM

## 2015-01-20 DIAGNOSIS — M25532 Pain in left wrist: Secondary | ICD-10-CM

## 2015-01-20 MED ORDER — OXYCODONE-ACETAMINOPHEN 5-325 MG PO TABS: 1.0000 | ORAL_TABLET | Freq: Four times a day (QID) | ORAL | Status: DC | PRN

## 2015-01-20 NOTE — Patient Instructions (Signed)
Wear wrist brace for next 4 weeks especially when you're going to be doing a lot of work in the yard. Icing 15 minutes at a time 3-4 times a day. I don't think you have reflex sympathetic dystrophy (the nerve thing I was talking about) but it wouldn't hurt to take vitamin C 500mg  daily for about 6 weeks. Follow up with me in 4 weeks for reevaluation.

## 2015-01-22 NOTE — Progress Notes (Signed)
PCP: Eulas Post, MD  Subjective:   HPI: Patient is a 64 y.o. male here for left wrist injury.  1/29: On 1/25 patient was coming down stairs, slipped on ice and sustained [place] injury to left wrist. Radiographs showed a displaced distal radius fracture with dorsal angulation along with a small ulnar styloid fracture. I performed a closed reduction in the ED which was successful - patient placed in posterior and sugar tong splints and instructed to follow-up today. Has been elevating, taking percocet. + swelling. Doing well in splints. Right handed.  2/5: Patient reports he is doing well. Aching and throbbing in wrist. Tolerating the wrist splint. Some swelling in fingers. Taking percocet as needed.  2/12: Patient reports he is doing well. Still having pain, some swelling into fingers. Pain level 7/10 at worst. Tolerating cast.  2/26: Patient reports he is doing well. Still with pain up to 7/10. Tolerating long arm cast. No other complaints.  3/11: Patient reports he no longer has pain. Doing well in short arm cast. Not taking any medication currently. No other complaints.  3/28: Overall patient is doing well. Does get pain in wrist when he overdoes it - worked in yard a lot recently. No pain in wrist brace. Does get swelling still. Taking pain medication as needed. Icing as needed.  Past Medical History  Diagnosis Date  . DIABETES MELLITUS, UNCONTROLLED 09/03/2010  . HYPERLIPIDEMIA 08/27/2010  . PERIPHERAL VASCULAR DISEASE 08/27/2010  . 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 WEIGHT LOSS 08/27/2010    Current Outpatient Prescriptions on File Prior to Visit  Medication Sig Dispense Refill  . aspirin 325 MG tablet Take 325 mg by mouth daily.      . Blood Glucose Monitoring Suppl (ONE TOUCH ULTRA SYSTEM KIT) W/DEVICE KIT 1 kit by Does not apply route once. Disp 09/03/10     . glimepiride (AMARYL) 2 MG tablet Take 1 tablet (2 mg total) by mouth daily with  breakfast. 30 tablet 5  . glucose blood (ONE TOUCH ULTRA TEST) test strip Use as instructed 3-4 times a week     . metFORMIN (GLUCOPHAGE) 500 MG tablet Take 2(Two)  tablets by mouth 2 (two) times daily. 360 tablet 2  . ONE TOUCH LANCETS MISC Use 3-4 times as week as directed     . simvastatin (ZOCOR) 80 MG tablet Take one half tablet daily 45 tablet 3   No current facility-administered medications on file prior to visit.    Past Surgical History  Procedure Laterality Date  . Cholecystectomy  1990  . Knee surgery  1995    left  . Back surgery  2003    No Known Allergies  History   Social History  . Marital Status: Single    Spouse Name: N/A  . Number of Children: N/A  . Years of Education: N/A   Occupational History  . Not on file.   Social History Main Topics  . Smoking status: Current Every Day Smoker -- 2.00 packs/day for 50 years  . Smokeless tobacco: Not on file  . Alcohol Use: 0.0 oz/week    0 Standard drinks or equivalent per week     Comment: occasional  . Drug Use: Not on file  . Sexual Activity: Not on file   Other Topics Concern  . Not on file   Social History Narrative    Family History  Problem Relation Age of Onset  . Heart disease Father   . Cancer Paternal Grandfather  lung    BP 143/84 mmHg  Pulse 82  Ht 6' (1.829 m)  Wt 170 lb (77.111 kg)  BMI 23.05 kg/m2  Review of Systems: See HPI above.    Objective:  Physical Exam:  Gen: NAD  Left wrist: Mild swelling of the wrist circumferentially.  No bruising, other deformity. FROM digits with 5/5 strength. Some pain and mod limitation wrist extension and flexion. No tenderness distal radius or ulna.  No skin irritation around thumb.  No erythema. Sensation intact all digits with < 2 sec cap refill.    Assessment & Plan:  1. Left distal radius and ulna fractures - s/p closed reduction and now 2 months out.  Radiographs last visit showed callus and clinically no longer tender at  fracture sites.  Ultrasound today reaffirms presence of callus.  As he's still struggling with some pain with a lot of activities will rest further with wrist brace for next 4 weeks.  Does not have the appearance of CRPS but he will take vitamin C as a precaution.  F/u in 4 weeks. Icing as needed.

## 2015-01-22 NOTE — Assessment & Plan Note (Signed)
s/p closed reduction and now 2 months out.  Radiographs last visit showed callus and clinically no longer tender at fracture sites.  Ultrasound today reaffirms presence of callus.  As he's still struggling with some pain with a lot of activities will rest further with wrist brace for next 4 weeks.  Does not have the appearance of CRPS but he will take vitamin C as a precaution.  F/u in 4 weeks. Icing as needed.

## 2015-02-17 ENCOUNTER — Ambulatory Visit (INDEPENDENT_AMBULATORY_CARE_PROVIDER_SITE_OTHER): Payer: Medicare Other | Admitting: Family Medicine

## 2015-02-17 ENCOUNTER — Encounter: Payer: Self-pay | Admitting: Family Medicine

## 2015-02-17 VITALS — BP 136/75 | HR 76 | Ht 72.0 in | Wt 170.0 lb

## 2015-02-17 DIAGNOSIS — S52502S Unspecified fracture of the lower end of left radius, sequela: Secondary | ICD-10-CM

## 2015-02-17 DIAGNOSIS — S52602S Unspecified fracture of lower end of left ulna, sequela: Secondary | ICD-10-CM

## 2015-02-17 NOTE — Patient Instructions (Signed)
Start occupational therapy for your wrist and hand. Icing 15 minutes at a time 3-4 times a day. Use brace only as needed. Follow up with me in 5-6 weeks.

## 2015-02-18 NOTE — Progress Notes (Signed)
PCP: Eulas Post, MD  Subjective:   HPI: Patient is a 64 y.o. male here for left wrist injury.  1/29: On 1/25 patient was coming down stairs, slipped on ice and sustained [place] injury to left wrist. Radiographs showed a displaced distal radius fracture with dorsal angulation along with a small ulnar styloid fracture. I performed a closed reduction in the ED which was successful - patient placed in posterior and sugar tong splints and instructed to follow-up today. Has been elevating, taking percocet. + swelling. Doing well in splints. Right handed.  2/5: Patient reports he is doing well. Aching and throbbing in wrist. Tolerating the wrist splint. Some swelling in fingers. Taking percocet as needed.  2/12: Patient reports he is doing well. Still having pain, some swelling into fingers. Pain level 7/10 at worst. Tolerating cast.  2/26: Patient reports he is doing well. Still with pain up to 7/10. Tolerating long arm cast. No other complaints.  3/11: Patient reports he no longer has pain. Doing well in short arm cast. Not taking any medication currently. No other complaints.  3/28: Overall patient is doing well. Does get pain in wrist when he overdoes it - worked in yard a lot recently. No pain in wrist brace. Does get swelling still. Taking pain medication as needed. Icing as needed.  4/25: Patient reports he's improved but still bothers him when he works a lot with his hands. Tightness when making a fist and still swells. Did not take vitamin C. No pain to the touch.  Past Medical History  Diagnosis Date  . DIABETES MELLITUS, UNCONTROLLED 09/03/2010  . HYPERLIPIDEMIA 08/27/2010  . PERIPHERAL VASCULAR DISEASE 08/27/2010  . 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 WEIGHT LOSS 08/27/2010    Current Outpatient Prescriptions on File Prior to Visit  Medication Sig Dispense Refill  . aspirin 325 MG tablet Take 325 mg by mouth daily.      . Blood Glucose  Monitoring Suppl (ONE TOUCH ULTRA SYSTEM KIT) W/DEVICE KIT 1 kit by Does not apply route once. Disp 09/03/10     . glimepiride (AMARYL) 2 MG tablet Take 1 tablet (2 mg total) by mouth daily with breakfast. 30 tablet 5  . glucose blood (ONE TOUCH ULTRA TEST) test strip Use as instructed 3-4 times a week     . metFORMIN (GLUCOPHAGE) 500 MG tablet Take 2(Two)  tablets by mouth 2 (two) times daily. 360 tablet 2  . ONE TOUCH LANCETS MISC Use 3-4 times as week as directed     . oxyCODONE-acetaminophen (PERCOCET/ROXICET) 5-325 MG per tablet Take 1 tablet by mouth every 6 (six) hours as needed for severe pain. 40 tablet 0  . simvastatin (ZOCOR) 80 MG tablet Take one half tablet daily 45 tablet 3   No current facility-administered medications on file prior to visit.    Past Surgical History  Procedure Laterality Date  . Cholecystectomy  1990  . Knee surgery  1995    left  . Back surgery  2003    No Known Allergies  History   Social History  . Marital Status: Single    Spouse Name: N/A  . Number of Children: N/A  . Years of Education: N/A   Occupational History  . Not on file.   Social History Main Topics  . Smoking status: Current Every Day Smoker -- 2.00 packs/day for 50 years  . Smokeless tobacco: Not on file  . Alcohol Use: 0.0 oz/week    0 Standard drinks or equivalent per  week     Comment: occasional  . Drug Use: Not on file  . Sexual Activity: Not on file   Other Topics Concern  . Not on file   Social History Narrative    Family History  Problem Relation Age of Onset  . Heart disease Father   . Cancer Paternal Grandfather     lung    BP 136/75 mmHg  Pulse 76  Ht 6' (1.829 m)  Wt 170 lb (77.111 kg)  BMI 23.05 kg/m2  Review of Systems: See HPI above.    Objective:  Physical Exam:  Gen: NAD  Left wrist: Mild swelling of the wrist circumferentially.  No bruising, other deformity. FROM digits with 5/5 strength. Some pain and mod limitation wrist  extension and flexion. No tenderness distal radius or ulna.  No skin irritation around thumb.  No erythema. Sensation intact all digits with < 2 sec cap refill.    Assessment & Plan:  1. Left distal radius and ulna fractures - s/p closed reduction and now 3 months out.  Radiographs and ultrasound have been reassuring and he is not tender at fracture sites now.  At this point I recommended we go ahead with occupational therapy to help get the rest of the swelling out and regain his motion, strength.  F/u in 5-6 weeks.

## 2015-02-18 NOTE — Assessment & Plan Note (Signed)
s/p closed reduction and now 3 months out.  Radiographs and ultrasound have been reassuring and he is not tender at fracture sites now.  At this point I recommended we go ahead with occupational therapy to help get the rest of the swelling out and regain his motion, strength.  F/u in 5-6 weeks.

## 2015-03-25 ENCOUNTER — Ambulatory Visit: Payer: Medicare Other | Admitting: Family Medicine

## 2015-03-28 ENCOUNTER — Other Ambulatory Visit: Payer: Self-pay | Admitting: Family Medicine

## 2015-07-10 ENCOUNTER — Other Ambulatory Visit: Payer: Self-pay | Admitting: Family Medicine

## 2015-08-06 ENCOUNTER — Other Ambulatory Visit: Payer: Self-pay | Admitting: Family Medicine

## 2015-09-02 ENCOUNTER — Other Ambulatory Visit: Payer: Self-pay | Admitting: Family Medicine

## 2015-10-05 ENCOUNTER — Other Ambulatory Visit: Payer: Self-pay | Admitting: Family Medicine

## 2015-10-06 ENCOUNTER — Other Ambulatory Visit: Payer: Self-pay | Admitting: Family Medicine

## 2015-11-02 ENCOUNTER — Other Ambulatory Visit: Payer: Self-pay | Admitting: Family Medicine

## 2016-01-01 ENCOUNTER — Other Ambulatory Visit: Payer: Self-pay | Admitting: Family Medicine

## 2016-01-02 ENCOUNTER — Other Ambulatory Visit: Payer: Self-pay | Admitting: Family Medicine

## 2016-01-23 ENCOUNTER — Other Ambulatory Visit: Payer: Self-pay | Admitting: Family Medicine

## 2016-04-13 ENCOUNTER — Ambulatory Visit (INDEPENDENT_AMBULATORY_CARE_PROVIDER_SITE_OTHER): Payer: Medicare Other | Admitting: Family Medicine

## 2016-04-13 VITALS — BP 128/78 | HR 77 | Temp 98.2°F | Ht 72.0 in | Wt 161.0 lb

## 2016-04-13 DIAGNOSIS — J040 Acute laryngitis: Secondary | ICD-10-CM

## 2016-04-13 DIAGNOSIS — R059 Cough, unspecified: Secondary | ICD-10-CM

## 2016-04-13 DIAGNOSIS — R05 Cough: Secondary | ICD-10-CM

## 2016-04-13 MED ORDER — DOXYCYCLINE HYCLATE 100 MG PO CAPS: 100.0000 mg | ORAL_CAPSULE | Freq: Two times a day (BID) | ORAL | Status: DC

## 2016-04-13 MED ORDER — HYDROCODONE-HOMATROPINE 5-1.5 MG/5ML PO SYRP: 5.0000 mL | ORAL_SOLUTION | Freq: Four times a day (QID) | ORAL | Status: AC | PRN

## 2016-04-13 NOTE — Progress Notes (Signed)
   Subjective:    Patient ID: Adam [name], male    DOB: 02/21/1951, 65 y.o.   MRN: [id number]  HPI Patient is a current smoker who is seen with 3-4 day history of laryngitis symptoms. Cough about 2 weeks duration. Cough is mostly nonproductive-occasionally productive early mornings. No associated fevers or chills. He's had some clear nasal discharge. No facial pain or upper teeth pain. Denies any hemoptysis.  He has type 2 diabetes and remains on metformin. Not monitoring blood sugars at home. He's not had follow-up in some time regarding his diabetes or hyperlipidemia. No recent chest pains.  Past Medical History  Diagnosis Date  . DIABETES MELLITUS, UNCONTROLLED 09/03/2010  . HYPERLIPIDEMIA 08/27/2010  . PERIPHERAL VASCULAR DISEASE 08/27/2010  . 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 WEIGHT LOSS 08/27/2010   Past Surgical History  Procedure Laterality Date  . Cholecystectomy  1990  . Knee surgery  1995    left  . Back surgery  2003    reports that he has been smoking.  He does not have any smokeless tobacco history on file. He reports that he drinks alcohol. His drug history is not on file. family history includes Cancer in his paternal grandfather; Heart disease in his father. No Known Allergies    Review of Systems  Constitutional: Negative for fever, chills and fatigue.  HENT: Positive for congestion and voice change. Negative for sore throat.   Eyes: Negative for visual disturbance.  Respiratory: Positive for cough. Negative for chest tightness, shortness of breath and wheezing.   Cardiovascular: Negative for chest pain, palpitations and leg swelling.  Neurological: Negative for dizziness, syncope, weakness, light-headedness and headaches.       Objective:   Physical Exam  Constitutional: He appears well-developed and well-nourished.  HENT:  Right Ear: External ear normal.  Left Ear: External ear normal.  Mouth/Throat: Oropharynx is clear and moist.  Neck: Neck  supple.  Cardiovascular: Normal rate and regular rhythm.   Pulmonary/Chest: Effort normal and breath sounds normal. No respiratory distress. He has no wheezes. He has no rales.  Lymphadenopathy:    He has no cervical adenopathy.          Assessment & Plan:  Laryngitis and cough. This may be viral but with 2 week history of cough start doxycycline and with his very likely COPD history. Doxycycline 1 mg twice a day for 10 days. Hycodan 1 teaspoon daily at bedtime for severe cough. Follow-up in 2 weeks. He needs medical follow-up with that point and will check A1c, lipids and assess medications at that point  Kristian CoveyBruce W Merridith Dershem MD [facility]

## 2016-04-13 NOTE — Patient Instructions (Signed)
Laryngitis  Laryngitis is inflammation of your vocal cords. This causes hoarseness, coughing, loss of voice, sore throat, or a dry throat. Your vocal cords are two bands of muscles that are found in your throat. When you speak, these cords come together and vibrate. These vibrations come out through your mouth as sound. When your vocal cords are inflamed, your voice sounds different.  Laryngitis can be temporary (acute) or long-term (chronic). Most cases of acute laryngitis improve with time. Chronic laryngitis is laryngitis that lasts for more than three weeks.  CAUSES  Acute laryngitis may be caused by:   A viral infection.   Lots of talking, yelling, or singing. This is also called vocal strain.   Bacterial infections.  Chronic laryngitis may be caused by:   Vocal strain.   Injury to your vocal cords.   Acid reflux (gastroesophageal reflux disease or GERD).   Allergies.   Sinus infection.   Smoking.   Alcohol abuse.   Breathing in chemicals or dust.   Growths on the vocal cords.  RISK FACTORS  Risk factors for laryngitis include:   Smoking.   Alcohol abuse.   Having allergies.  SIGNS AND SYMPTOMS  Symptoms of laryngitis may include:   Low, hoarse voice.   Loss of voice.   Dry cough.   Sore throat.   Stuffy nose.  DIAGNOSIS  Laryngitis may be diagnosed by:   Physical exam.   Throat culture.   Blood test.   Laryngoscopy. This procedure allows your health care provider to look at your vocal cords with a mirror or viewing tube.  TREATMENT  Treatment for laryngitis depends on what is causing it. Usually, treatment involves resting your voice and using medicines to soothe your throat. However, if your laryngitis is caused by a bacterial infection, you may need to take antibiotic medicine. If your laryngitis is caused by a growth, you may need to have a procedure to remove it.  HOME CARE INSTRUCTIONS   Drink enough fluid to keep your urine clear or pale yellow.   Breathe in moist air. Use a  humidifier if you live in a dry climate.   Take medicines only as directed by your health care provider.   If you were prescribed an antibiotic medicine, finish it all even if you start to feel better.   Do not smoke cigarettes or electronic cigarettes. If you need help quitting, ask your health care provider.   Talk as little as possible. Also avoid whispering, which can cause vocal strain.   Write instead of talking. Do this until your voice is back to normal.  SEEK MEDICAL CARE IF:   You have a fever.   You have increasing pain.   You have difficulty swallowing.  SEEK IMMEDIATE MEDICAL CARE IF:   You cough up blood.   You have trouble breathing.     This information is not intended to replace advice given to you by your health care provider. Make sure you discuss any questions you have with your health care provider.     Document Released: 10/11/2005 Document Revised: 11/01/2014 Document Reviewed: 03/26/2014  Elsevier Interactive Patient Education 2016 [employer] Inc[employer]

## 2016-04-13 NOTE — Progress Notes (Signed)
Pre visit review using our clinic review tool, if applicable. No additional management support is needed unless otherwise documented below in the visit note. 

## 2016-04-14 ENCOUNTER — Other Ambulatory Visit: Payer: Self-pay | Admitting: Family Medicine

## 2016-04-14 MED ORDER — ACCU-CHEK SOFTCLIX LANCET DEV MISC: Status: AC

## 2016-04-14 MED ORDER — GLUCOSE BLOOD VI STRP: ORAL_STRIP | Status: AC

## 2016-04-26 ENCOUNTER — Encounter: Payer: Self-pay | Admitting: Family Medicine

## 2016-04-26 ENCOUNTER — Ambulatory Visit (INDEPENDENT_AMBULATORY_CARE_PROVIDER_SITE_OTHER): Payer: Medicare Other | Admitting: Family Medicine

## 2016-04-26 VITALS — BP 132/90 | HR 78 | Temp 97.8°F | Resp 16 | Ht 72.0 in | Wt 161.0 lb

## 2016-04-26 DIAGNOSIS — E1151 Type 2 diabetes mellitus with diabetic peripheral angiopathy without gangrene: Secondary | ICD-10-CM | POA: Diagnosis not present

## 2016-04-26 DIAGNOSIS — I739 Peripheral vascular disease, unspecified: Secondary | ICD-10-CM | POA: Diagnosis not present

## 2016-04-26 DIAGNOSIS — E785 Hyperlipidemia, unspecified: Secondary | ICD-10-CM | POA: Diagnosis not present

## 2016-04-26 LAB — LIPID PANEL
CHOL/HDL RATIO: 3
Cholesterol: 133 mg/dL (ref 0–200)
HDL: 41.8 mg/dL (ref >39.00)
LDL CALC: 64 mg/dL (ref 0–99)
NonHDL: 91.65
TRIGLYCERIDES: 136 mg/dL (ref 0.0–149.0)
VLDL: 27.2 mg/dL (ref 0.0–40.0)

## 2016-04-26 LAB — BASIC METABOLIC PANEL
BUN: 7 mg/dL (ref 6–23)
CO2: 29 meq/L (ref 19–32)
Calcium: 9 mg/dL (ref 8.4–10.5)
Chloride: 104 mEq/L (ref 96–112)
Creatinine, Ser: 1.05 mg/dL (ref 0.40–1.50)
GFR: 74.79 mL/min (ref >60.00)
GLUCOSE: 120 mg/dL — AB (ref 70–99)
POTASSIUM: 3.7 meq/L (ref 3.5–5.1)
SODIUM: 137 meq/L (ref 135–145)

## 2016-04-26 LAB — MICROALBUMIN / CREATININE URINE RATIO
CREATININE, U: 41.8 mg/dL
Microalb Creat Ratio: 1.7 mg/g (ref 0.0–30.0)
Microalb, Ur: <0.7 mg/dL (ref 0.0–1.9)

## 2016-04-26 LAB — HEMOGLOBIN A1C: Hgb A1c MFr Bld: 8.6 % — ABNORMAL HIGH (ref 4.6–6.5)

## 2016-04-26 NOTE — Progress Notes (Signed)
   Subjective:    Patient ID: Adam LaiFred A Schillaci, male    DOB: 02/05/1951, 65 y.o.   MRN: [id number]  HPI  Patient seen for medical follow-up Recent cough which improved following doxycycline. He has no fever or chills at this point. Still smoking. No recent hemoptysis or any weight changes  Type 2 diabetes. Does not monitor blood sugars. No polyuria or polydipsia. Takes metformin. Apparently never started Amaryl. Overdue for A1c. Does not get regular eye exams  History of peripheral vascular disease. Denies recent claudication symptoms. Hyperlipidemia treated with simvastatin. No myalgias. No recent chest pains.  Past Medical History  Diagnosis Date  . DIABETES MELLITUS, UNCONTROLLED 09/03/2010  . HYPERLIPIDEMIA 08/27/2010  . PERIPHERAL VASCULAR DISEASE 08/27/2010  . 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 WEIGHT LOSS 08/27/2010   Past Surgical History  Procedure Laterality Date  . Cholecystectomy  1990  . Knee surgery  1995    left  . Back surgery  2003    reports that he has been smoking.  He does not have any smokeless tobacco history on file. He reports that he drinks alcohol. His drug history is not on file. family history includes Cancer in his paternal grandfather; Heart disease in his father. No Known Allergies   Review of Systems  Constitutional: Negative for fatigue.  Eyes: Negative for visual disturbance.  Respiratory: Negative for cough, chest tightness and shortness of breath.   Cardiovascular: Negative for chest pain, palpitations and leg swelling.  Endocrine: Negative for polydipsia and polyuria.  Genitourinary: Negative for dysuria.  Neurological: Negative for dizziness, syncope, weakness, light-headedness and headaches.       Objective:   Physical Exam  Constitutional: He is oriented to person, place, and time. He appears well-developed and well-nourished.  HENT:  Right Ear: External ear normal.  Left Ear: External ear normal.  Mouth/Throat: Oropharynx is  clear and moist.  Eyes: Pupils are equal, round, and reactive to light.  Neck: Neck supple. No thyromegaly present.  Cardiovascular: Normal rate and regular rhythm.   Pulmonary/Chest: Effort normal and breath sounds normal. No respiratory distress. He has no wheezes. He has no rales.  Musculoskeletal: He exhibits no edema.  Neurological: He is alert and oriented to person, place, and time.  Skin:  Both feet are fairly warm to touch. Good capillary refill. Diminished dorsalis pedis pulse left foot and 1+ on the right. No skin lesions. No calluses.          Assessment & Plan:  #1 type 2 diabetes. Unknown current level of control. Recheck A1c. Check urine microalbumin. He is encouraged to set up eye exam  #2 dyslipidemia. Check lipid and hepatic panel. Continue simvastatin  #3 ongoing nicotine use. He strongly encouraged to stop smoking altogether. Current motivation is low  #4 recent cough. Resolving. Probably has COPD. He does not have any significant dyspnea with day-to-day activities. He is not interested in inhaler therapy at this time. He is again strongly advised to stop smoking  Kristian CoveyBruce W Burchette MD [facility]

## 2016-05-17 ENCOUNTER — Other Ambulatory Visit: Payer: Self-pay | Admitting: *Deleted

## 2016-05-17 MED ORDER — SIMVASTATIN 80 MG PO TABS: ORAL_TABLET | ORAL | 1 refills | Status: DC

## 2016-05-17 MED ORDER — METFORMIN HCL 500 MG PO TABS: ORAL_TABLET | ORAL | 2 refills | Status: DC

## 2016-12-15 ENCOUNTER — Other Ambulatory Visit: Payer: Self-pay | Admitting: Family Medicine

## 2017-01-15 IMAGING — CR DG WRIST COMPLETE 3+V*L*
4 series · 4 of 4 positions shown · non-contrast
Comparison: AP and lateral views of the left wrist [DATE]

CLINICAL DATA: Status post fall on the ice on [REDACTED] with
subsequent fracture, persistent left wrist pain, in plaster views.

EXAM:
LEFT WRIST - COMPLETE 3+ VIEW

[x wrist pa left *]
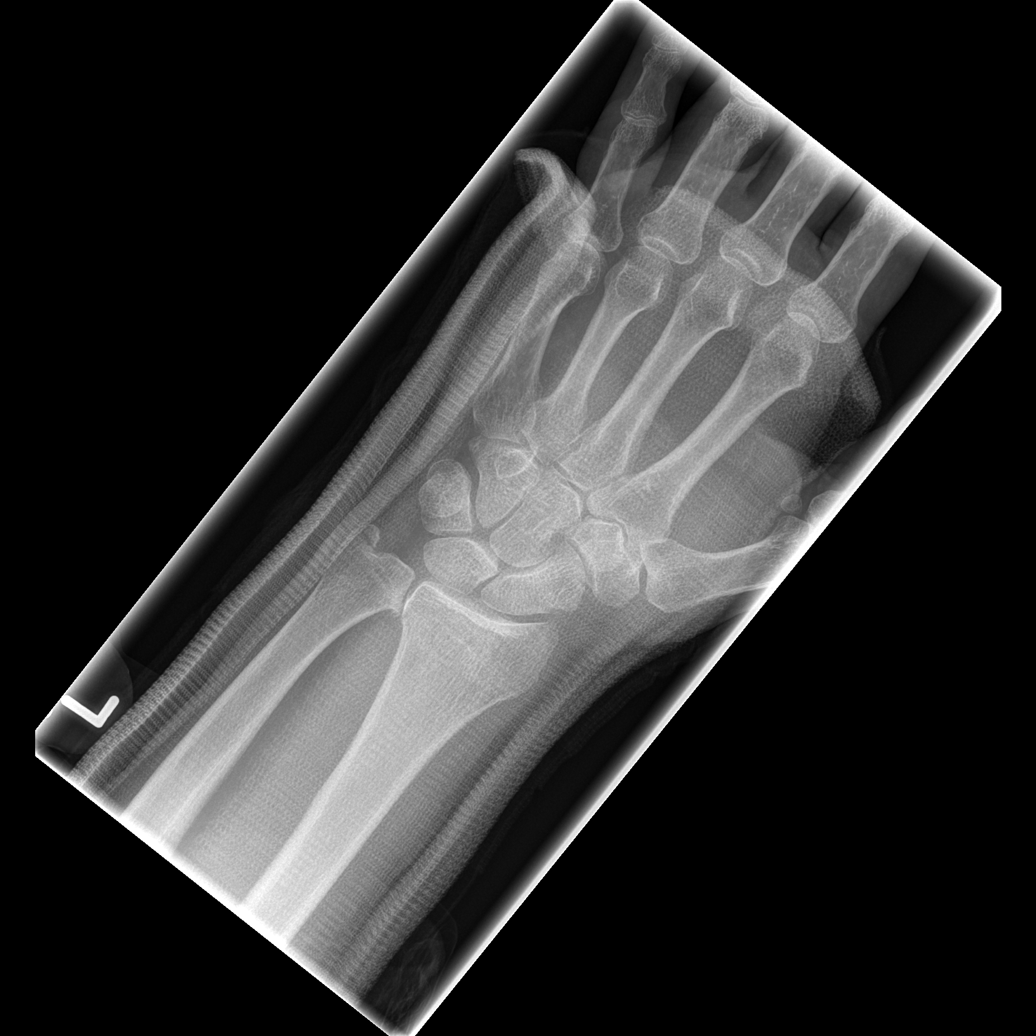

[x wrist obl left *]
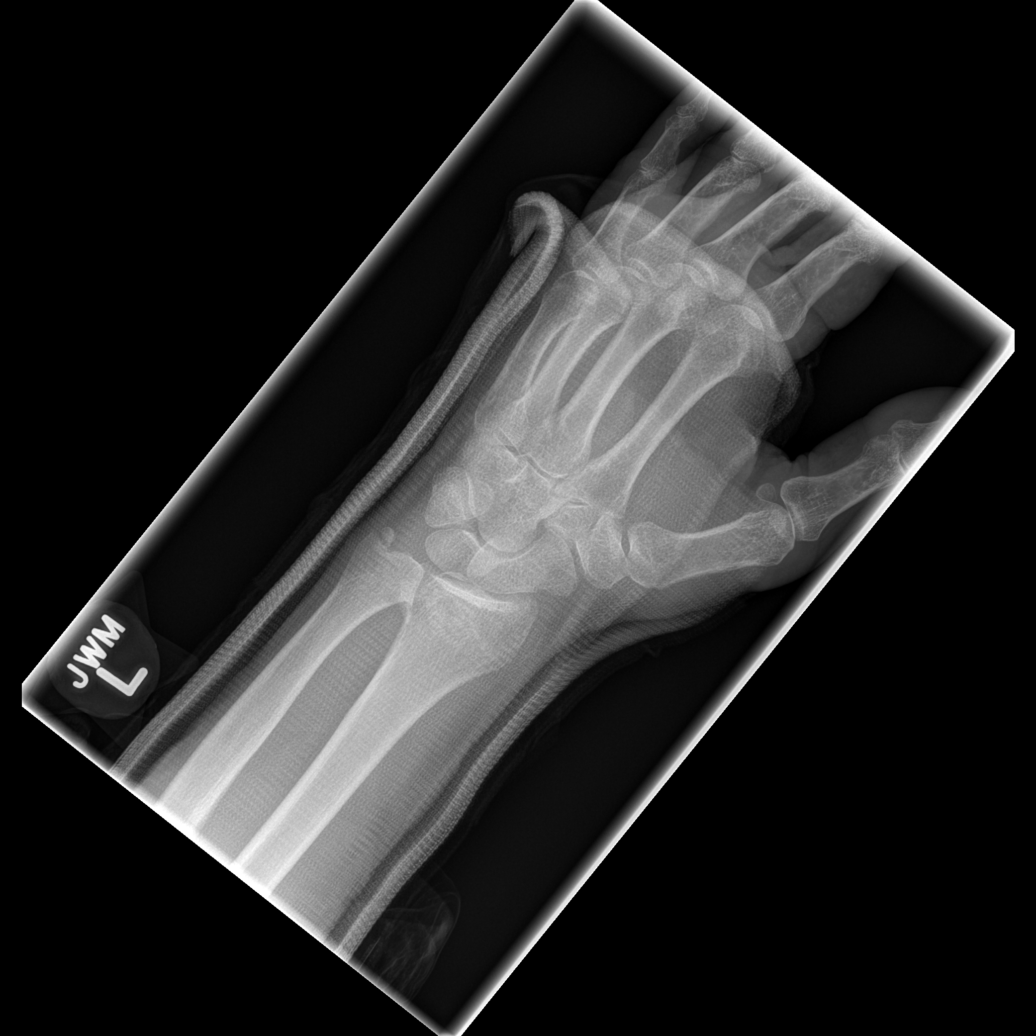

[x wrist lat left]
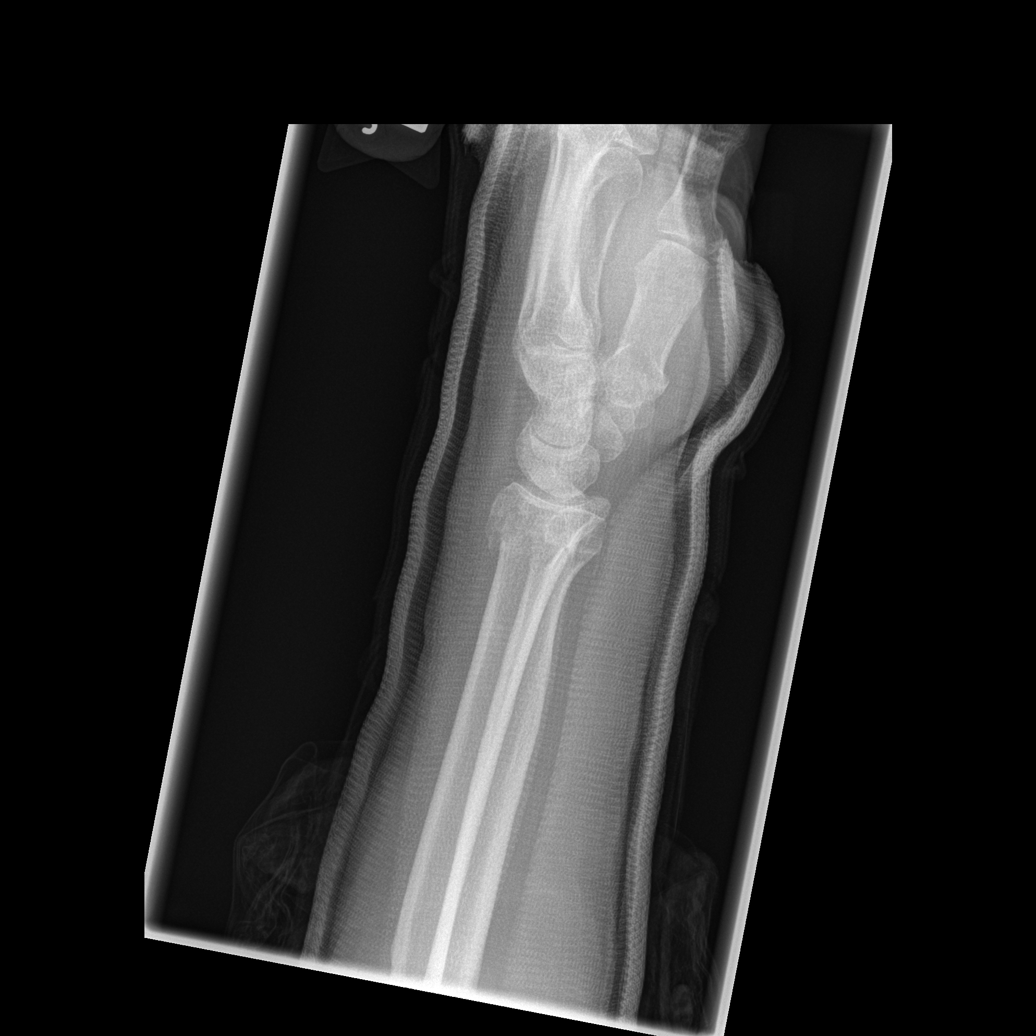

[x navicular *]
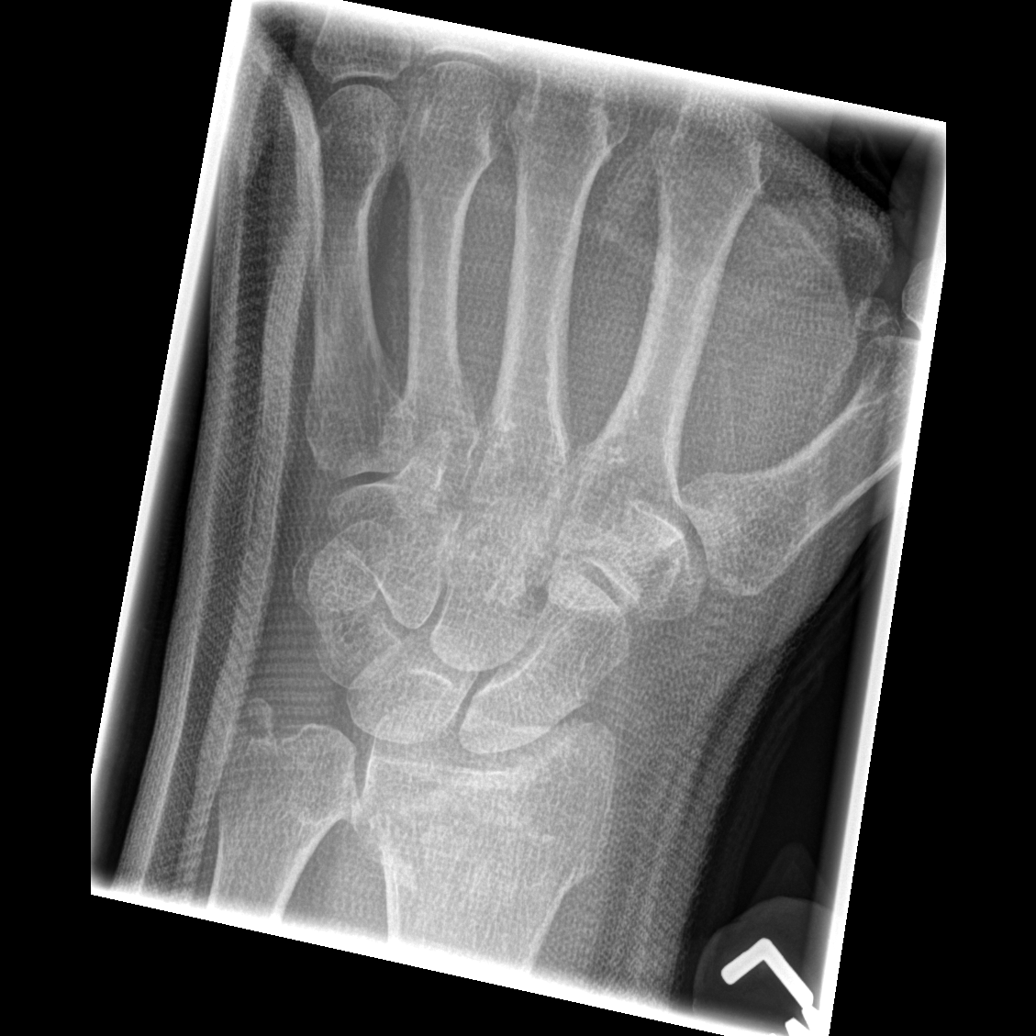

[4 of 4 positions shown; findings below may reference images not displayed]

FINDINGS: The impacted fracture of the distal radial metaphysis is in near
anatomic alignment. The ulnar styloid fracture is visible with near
anatomic alignment. The carpal bones and metacarpals exhibit no
acute abnormalities where visualized.
IMPRESSION: In plaster views of the left wrist reveal the impacted fracture of
the distal radial metaphysis and the ulnar styloid fracture to be in
near anatomic alignment.

## 2017-01-29 IMAGING — DX DG WRIST COMPLETE 3+V*L*
4 series · 4 of 4 positions shown · non-contrast
Comparison: None.

CLINICAL DATA: Left wrist fracture.

EXAM:
LEFT WRIST - COMPLETE 3+ VIEW

[wrist pa]
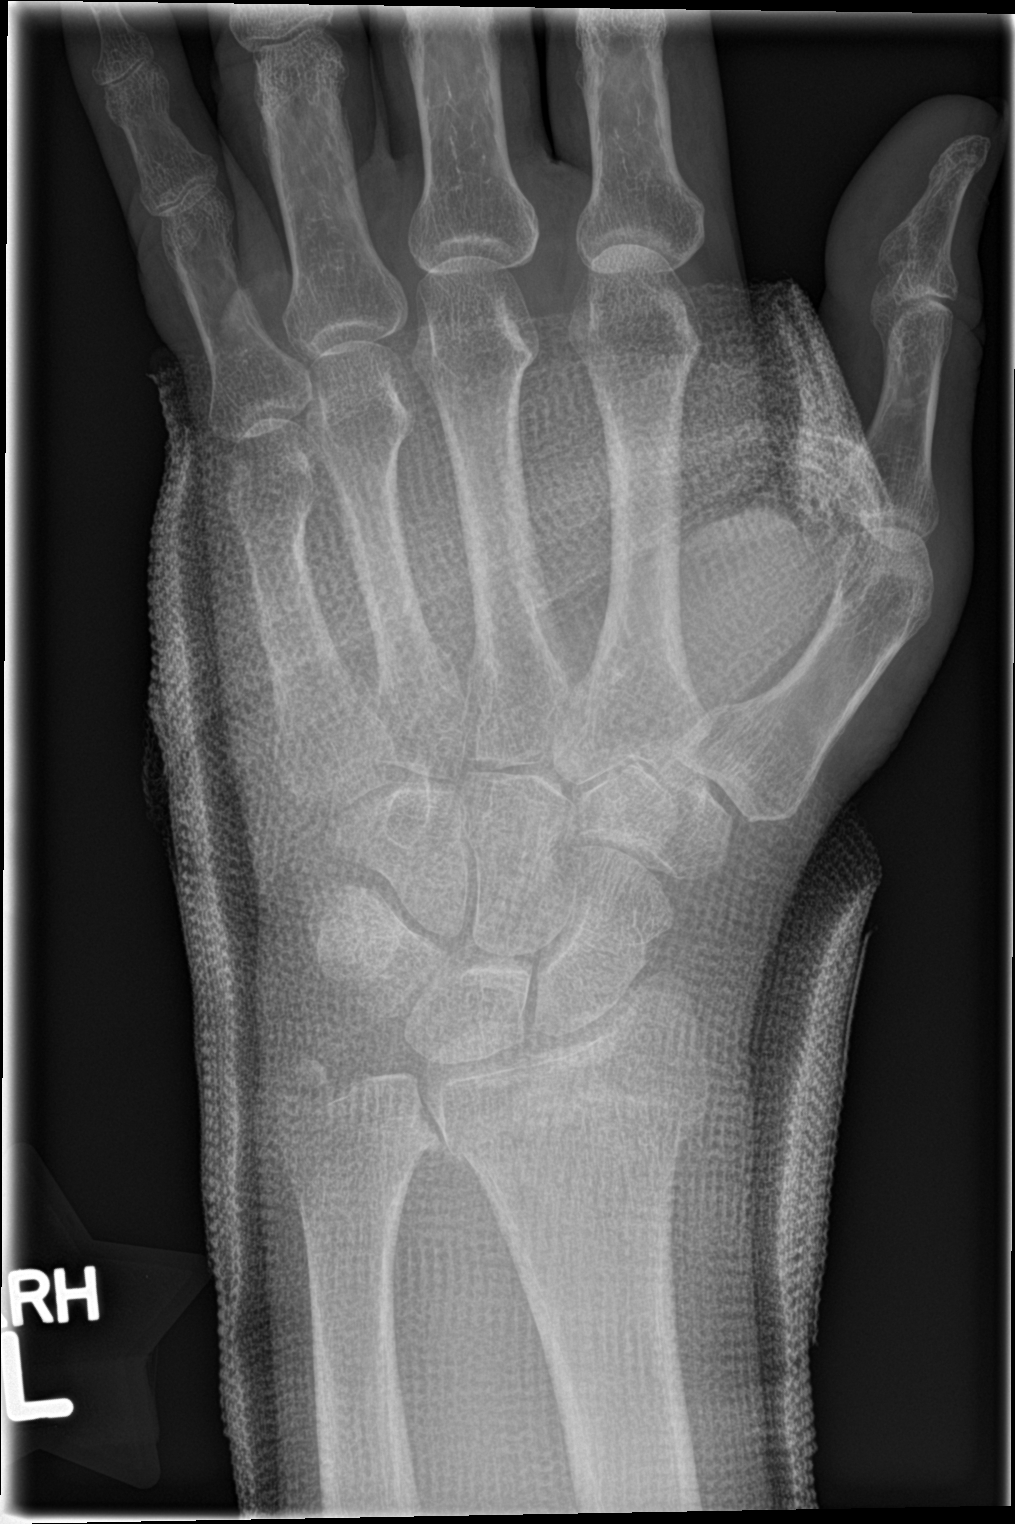

[wrist obl]
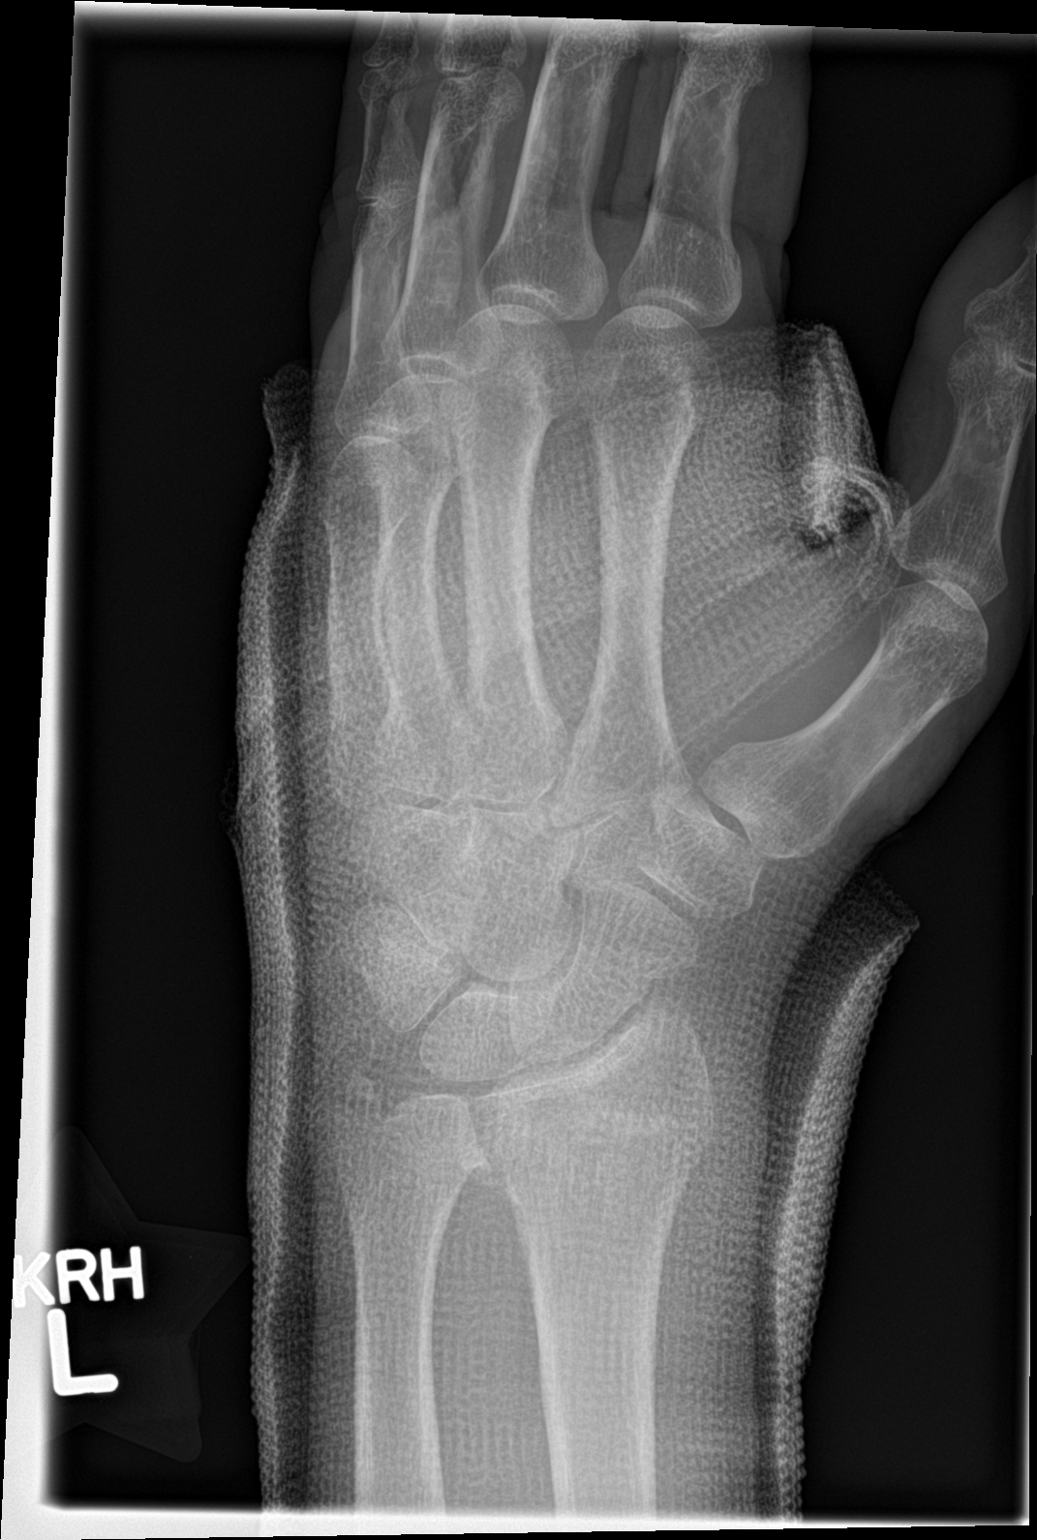

[wrist lat]
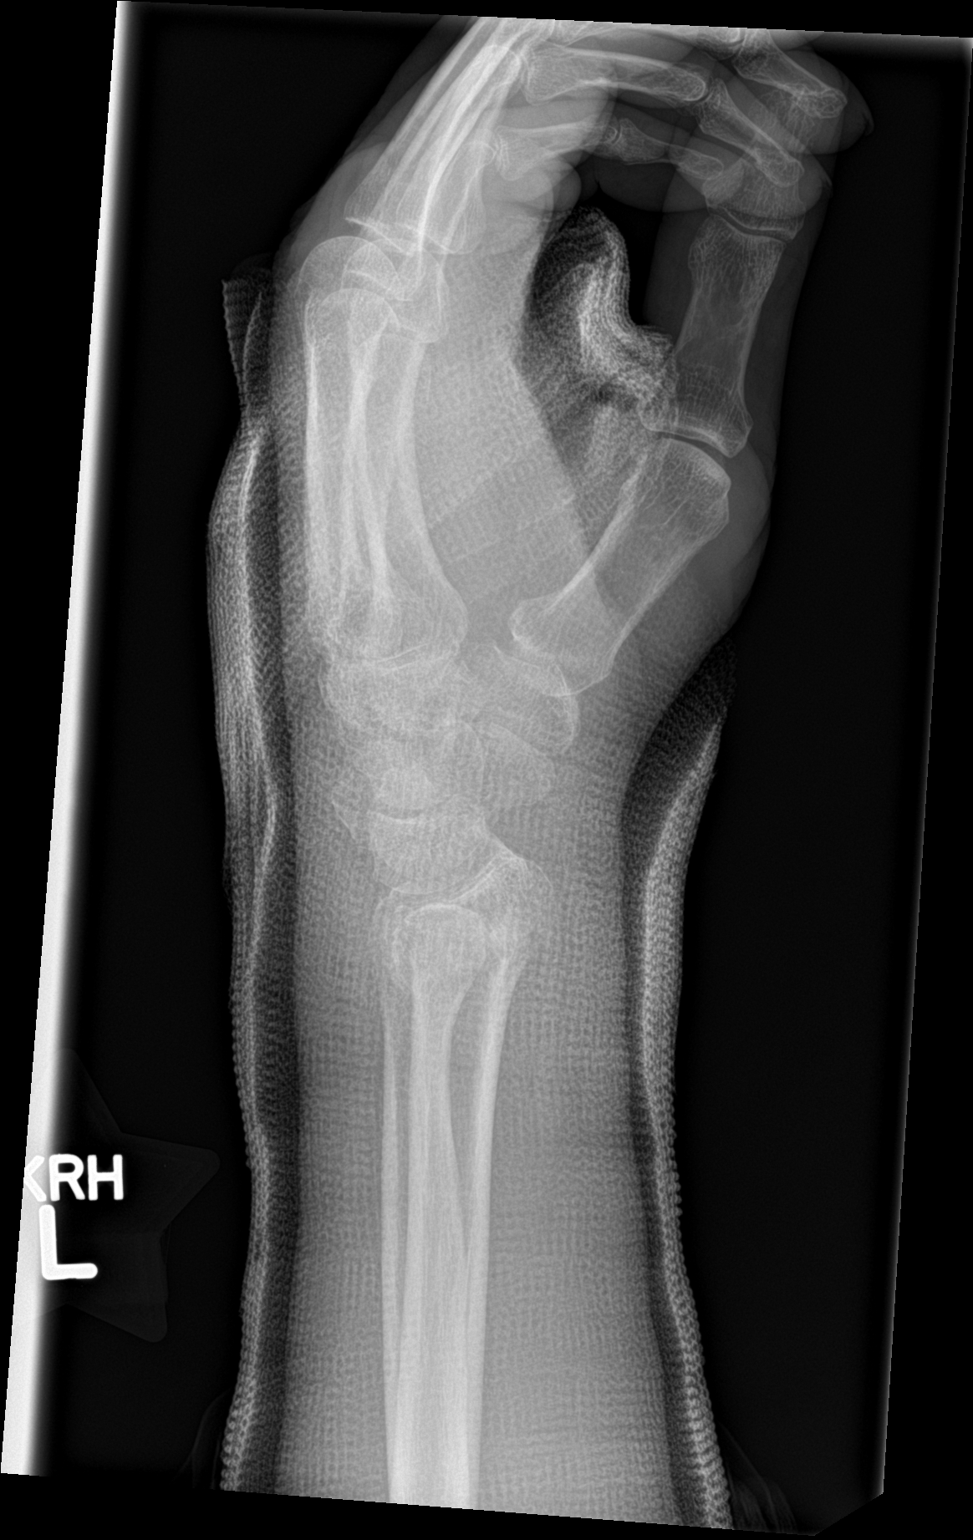

[wrist navicular]
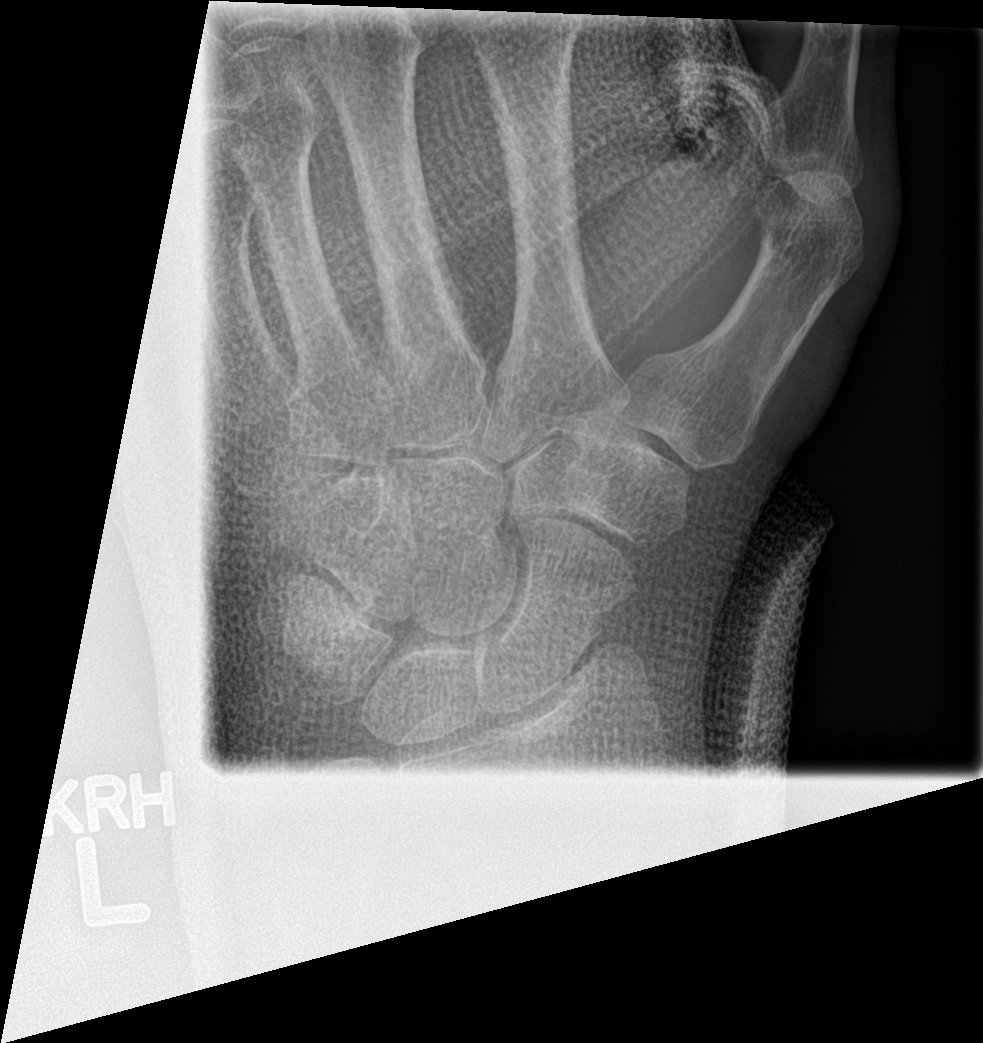

[4 of 4 positions shown; findings below may reference images not displayed]

FINDINGS: Patient is a cast. Distal radial metaphysis and ulnar styloid
fractures are in stable position. Some degree of callus formation
appears to be present.
IMPRESSION: Stable appearance of distal radial fracture and ulnar styloid
fractures. Some degree of callus formation appears to be present
about the distal radial fracture. Patient is in cast.

## 2017-03-17 ENCOUNTER — Other Ambulatory Visit: Payer: Self-pay | Admitting: Family Medicine

## 2017-04-12 ENCOUNTER — Other Ambulatory Visit: Payer: Self-pay | Admitting: Family Medicine

## 2017-04-22 NOTE — Progress Notes (Addendum)
Subjective:   Adam [name] is a 66 y.o. male who presents for an Initial Medicare Annual Wellness Visit.  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   Review of Systems  No ROS.  Medicare Wellness Visit. Additional risk factors are reflected in the social history.  Cardiac Risk Factors include: advanced age (>2555men, 80>65 women);diabetes mellitus;dyslipidemia;smoking/ tobacco exposure;family history of premature cardiovascular disease;male gender   Sleep patterns: sleeps 6-8 hours, naps during the day. Feels rested.   Home Safety/Smoke Alarms: Feels safe in home. Smoke alarms in place.  Living environment; residence and Firearm Safety: Lives with fiance in 1 story home.  Seat Belt Safety/Bike Helmet: Wears seat belt.   Counseling:   Eye Exam-Last exam > 5 years ago. Patient will schedule.  Dental-Last exam 03/2017, only with issues.   Male:   CCS-Declines testing.     PSA- Lab Results  Component Value Date   PSA 1.02 08/27/2010      Objective:    Today's Vitals   04/25/17 0915  BP: 132/68  Pulse: 63  SpO2: 95%  Weight: 165 lb 12.8 oz (75.2 kg)  Height: 6' (1.829 m)   Body mass index is 22.49 kg/m.  Current Medications (verified) Outpatient Encounter Prescriptions as of 04/25/2017  Medication Sig  . aspirin 325 MG tablet Take 650 mg by mouth daily.   Marland Kitchen. glucose blood (ACCU-CHEK AVIVA PLUS) test strip Use to check blood sugars once daily. Dx: E11.65  . Lancet Devices (ACCU-CHEK SOFTCLIX) lancets Use to check blood sugars once daily. Dx: E11.65  . metFORMIN (GLUCOPHAGE) 500 MG tablet TAKE 2(TWO) TABLETS BY MOUTH 2 (TWO) TIMES DAILY.  . ONE TOUCH LANCETS MISC Use 3-4 times as week as directed   . simvastatin (ZOCOR) 80 MG tablet TAKE ONE HALF TABLET DAILY  . glimepiride (AMARYL) 2 MG tablet Pt needs an appointment for any further refills (Patient not taking: Reported on 04/13/2016)   No facility-administered encounter medications on file as of 04/25/2017.     Allergies  (verified) Patient has no known allergies.   History: Past Medical History:  Diagnosis Date  . DIABETES MELLITUS, UNCONTROLLED 09/03/2010  . HYPERLIPIDEMIA 08/27/2010  . 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 PERIPHERAL VASCULAR DISEASE 08/27/2010  . WEIGHT LOSS 08/27/2010   Past Surgical History:  Procedure Laterality Date  . BACK SURGERY  2003  . CHOLECYSTECTOMY  1990  . KNEE SURGERY  1995   left   Family History  Problem Relation Age of Onset  . Heart disease Father   . Cancer Paternal Grandfather        lung   Social History   Occupational History  . Not on file.   Social History Main Topics  . Smoking status: Current Every Day Smoker    Packs/day: 1.00    Years: 50.00    Types: Cigarettes  . Smokeless tobacco: Never Used  . Alcohol use 0.0 oz/week     Comment: occasional  . Drug use: No  . Sexual activity: Not on file   Tobacco Counseling Ready to quit: No Counseling given: No   Activities of Daily Living In your present state of health, do you have any difficulty performing the following activities: 04/25/2017  Hearing? N  Vision? N  Difficulty concentrating or making decisions? N  Walking or climbing stairs? N  Dressing or bathing? N  Doing errands, shopping? N  Preparing Food and eating ? N  Using the Toilet? N  In the past six months, have you  accidently leaked urine? N  Do you have problems with loss of bowel control? N  Managing your Medications? N  Managing your Finances? N  Housekeeping or managing your Housekeeping? N  Some recent data might be hidden    Immunizations and Health Maintenance Immunization History  Administered Date(s) Administered  . Pneumococcal Conjugate-13 05/09/2014  . Pneumococcal Polysaccharide-23 04/25/2017   Health Maintenance Due  Topic Date Due  . Hepatitis C Screening  [birth year]  . FOOT EXAM  05/10/2015  . OPHTHALMOLOGY EXAM  05/10/2015  . HEMOGLOBIN A1C  10/27/2016    Patient Care Team: Kristian Covey, MD as PCP - [facility], [facility] (Specialist)  Indicate any recent Medical Services you may have received from other than [facility] providers in the past year (date may be approximate).    Assessment:   This is a routine wellness examination for Adam [name]. Physical assessment deferred to PCP.   Hearing/Vision screen Hearing Screening Comments: Able to hear conversational tones w/o difficulty. No issues reported.   Vision Screening Comments: Wears reading glasses.   Dietary issues and exercise activities discussed: Current Exercise Habits: The patient does not participate in regular exercise at present, Exercise limited by: None identified   Diet (meal preparation, eat out, water intake, caffeinated beverages, dairy products, fruits and vegetables): Drinks sweet tea and soda.   Breakfast: eggs, grits, ham Lunch: skips Dinner: fried chicken, potatoes/gravy, yam, vegetables.    Discussed heart healthy/diabetic diet.   Goals    . Gain weight          Increase weight.      Depression Screen PHQ 2/9 Scores 04/25/2017 04/26/2016 05/09/2014  PHQ - 2 Score 0 0 0    Fall Risk Fall Risk  04/25/2017 04/26/2016  Falls in the past year? Yes No  Number falls in past yr: 2 or more -  Injury with Fall? No -  Follow up Falls prevention discussed -    Cognitive Function:       Ad8 score reviewed for issues:  Issues making decisions: no  Less interest in hobbies / activities: no  Repeats questions, stories (family complaining): no  Trouble using ordinary gadgets (microwave, computer, phone): no  Forgets the month or year: no  Mismanaging finances: no  Remembering appts: no  Daily problems with thinking and/or memory: no Ad8 score is=0     Screening Tests Health Maintenance  Topic Date Due  . Hepatitis C Screening  Sep 03, 1951  . FOOT EXAM  05/10/2015  . OPHTHALMOLOGY EXAM  05/10/2015  . HEMOGLOBIN A1C  10/27/2016  . COLONOSCOPY  04/25/2018 (Originally  11/26/1998)  . TETANUS/TDAP  04/25/2018 (Originally 11/27/1967)  . URINE MICROALBUMIN  04/26/2017  . INFLUENZA VACCINE  05/25/2017  . PNA vac Low Risk Adult  Completed   Diabetic Foot Exam - Simple   Simple Foot Form Visual Inspection No deformities, no ulcerations, no other skin breakdown bilaterally:  Yes Sensation Testing Intact to touch and monofilament testing bilaterally:  Yes Pulse Check Posterior Tibialis and Dorsalis pulse intact bilaterally:  Yes Comments Pt reports occasional numbness.     Declines Colonoscopy PPSV23 administered today.  Pt will schedule eye exam.  Hep C with future labs      Plan:    Schedule eye appointment.   Bring a copy of your advance directives to your next office visit.   I have personally reviewed and noted the following in the patient's chart:   . Medical and social history . Use of alcohol,  tobacco or illicit drugs  . Current medications and supplements . Functional ability and status . Nutritional status . Physical activity . Advanced directives . List of other physicians . Hospitalizations, surgeries, and ER visits in previous 12 months . Vitals . Screenings to include cognitive, depression, and falls . Referrals and appointments  In addition, I have reviewed and discussed with patient certain preventive protocols, quality metrics, and best practice recommendations. A written personalized care plan for preventive services as well as general preventive health recommendations were provided to patient.     Alysia Penna, RN   04/25/2017    PCP Notes: -Pt not fasting today, would like Hep C screening with next lab draw.  -Declines Colonoscopy -PPSV23 administered today.  -Pt will schedule eye exam.  -Pt reports imbalance issues and decreased weight.  -Right great toenail lifting up, referral to podiatry requested. Message sent.  -F/U with PCP Monday, 05/02/2017 @ 1030 (fasting)  Agree with assessment as above  Kristian Covey MD [facility] at [facility]

## 2017-04-25 ENCOUNTER — Other Ambulatory Visit: Payer: Self-pay

## 2017-04-25 ENCOUNTER — Ambulatory Visit (INDEPENDENT_AMBULATORY_CARE_PROVIDER_SITE_OTHER): Payer: Medicare PPO

## 2017-04-25 ENCOUNTER — Telehealth: Payer: Self-pay

## 2017-04-25 VITALS — BP 132/68 | HR 63 | Ht 72.0 in | Wt 165.8 lb

## 2017-04-25 DIAGNOSIS — Z23 Encounter for immunization: Secondary | ICD-10-CM | POA: Diagnosis not present

## 2017-04-25 DIAGNOSIS — Z1159 Encounter for screening for other viral diseases: Secondary | ICD-10-CM

## 2017-04-25 DIAGNOSIS — Z0000 Encounter for general adult medical examination without abnormal findings: Secondary | ICD-10-CM

## 2017-04-25 DIAGNOSIS — E785 Hyperlipidemia, unspecified: Secondary | ICD-10-CM | POA: Diagnosis not present

## 2017-04-25 DIAGNOSIS — E119 Type 2 diabetes mellitus without complications: Secondary | ICD-10-CM

## 2017-04-25 NOTE — Telephone Encounter (Signed)
Patient in for AWV today with Health Coach. Patient requesting referral to podiatry for toenail trim/care (possible onycholysis of right great toenail).

## 2017-04-25 NOTE — Patient Instructions (Addendum)
Schedule eye appointment.   Bring a copy of your advance directives to your next office visit.  Fall Prevention in the Home Falls can cause injuries. They can happen to people of all ages. There are many things you can do to make your home safe and to help prevent falls. What can I do on the outside of my home?  Regularly fix the edges of walkways and driveways and fix any cracks.  Remove anything that might make you trip as you walk through a door, such as a raised step or threshold.  Trim any bushes or trees on the path to your home.  Use bright outdoor lighting.  Clear any walking paths of anything that might make someone trip, such as rocks or tools.  Regularly check to see if handrails are loose or broken. Make sure that both sides of any steps have handrails.  Any raised decks and porches should have guardrails on the edges.  Have any leaves, snow, or ice cleared regularly.  Use sand or salt on walking paths during winter.  Clean up any spills in your garage right away. This includes oil or grease spills. What can I do in the bathroom?  Use night lights.  Install grab bars by the toilet and in the tub and shower. Do not use towel bars as grab bars.  Use non-skid mats or decals in the tub or shower.  If you need to sit down in the shower, use a plastic, non-slip stool.  Keep the floor dry. Clean up any water that spills on the floor as soon as it happens.  Remove soap buildup in the tub or shower regularly.  Attach bath mats securely with double-sided non-slip rug tape.  Do not have throw rugs and other things on the floor that can make you trip. What can I do in the bedroom?  Use night lights.  Make sure that you have a light by your bed that is easy to reach.  Do not use any sheets or blankets that are too big for your bed. They should not hang down onto the floor.  Have a firm chair that has side arms. You can use this for support while you get  dressed.  Do not have throw rugs and other things on the floor that can make you trip. What can I do in the kitchen?  Clean up any spills right away.  Avoid walking on wet floors.  Keep items that you use a lot in easy-to-reach places.  If you need to reach something above you, use a strong step stool that has a grab bar.  Keep electrical cords out of the way.  Do not use floor polish or wax that makes floors slippery. If you must use wax, use non-skid floor wax.  Do not have throw rugs and other things on the floor that can make you trip. What can I do with my stairs?  Do not leave any items on the stairs.  Make sure that there are handrails on both sides of the stairs and use them. Fix handrails that are broken or loose. Make sure that handrails are as long as the stairways.  Check any carpeting to make sure that it is firmly attached to the stairs. Fix any carpet that is loose or worn.  Avoid having throw rugs at the top or bottom of the stairs. If you do have throw rugs, attach them to the floor with carpet tape.  Make sure that you have  a light switch at the top of the stairs and the bottom of the stairs. If you do not have them, ask someone to add them for you. What else can I do to help prevent falls?  Wear shoes that: ? Do not have high heels. ? Have rubber bottoms. ? Are comfortable and fit you well. ? Are closed at the toe. Do not wear sandals.  If you use a stepladder: ? Make sure that it is fully opened. Do not climb a closed stepladder. ? Make sure that both sides of the stepladder are locked into place. ? Ask someone to hold it for you, if possible.  Clearly mark and make sure that you can see: ? Any grab bars or handrails. ? First and last steps. ? Where the edge of each step is.  Use tools that help you move around (mobility aids) if they are needed. These include: ? Canes. ? Walkers. ? Scooters. ? Crutches.  Turn on the lights when you go into a  dark area. Replace any light bulbs as soon as they burn out.  Set up your furniture so you have a clear path. Avoid moving your furniture around.  If any of your floors are uneven, fix them.  If there are any pets around you, be aware of where they are.  Review your medicines with your doctor. Some medicines can make you feel dizzy. This can increase your chance of falling. Ask your doctor what other things that you can do to help prevent falls. This information is not intended to replace advice given to you by your health care provider. Make sure you discuss any questions you have with your health care provider. Document Released: 08/07/2009 Document Revised: 03/18/2016 Document Reviewed: 11/15/2014 Elsevier Interactive Patient Education  2018 [employer].   Health Maintenance, Male A healthy lifestyle and preventive care is important for your health and wellness. Ask your health care provider about what schedule of regular examinations is right for you. What should I know about weight and diet? Eat a Healthy Diet  Eat plenty of vegetables, fruits, whole grains, low-fat dairy products, and lean protein.  Do not eat a lot of foods high in solid fats, added sugars, or salt.  Maintain a Healthy Weight Regular exercise can help you achieve or maintain a healthy weight. You should:  Do at least 150 minutes of exercise each week. The exercise should increase your heart rate and make you sweat (moderate-intensity exercise).  Do strength-training exercises at least twice a week.  Watch Your Levels of Cholesterol and Blood Lipids  Have your blood tested for lipids and cholesterol every 5 years starting at 68 years of age. If you are at high risk for heart disease, you should start having your blood tested when you are 68 years old. You may need to have your cholesterol levels checked more often if: ? Your lipid or cholesterol levels are high. ? You are older than 68 years of age. ? You  are at high risk for heart disease.  What should I know about cancer screening? Many types of cancers can be detected early and may often be prevented. Lung Cancer  You should be screened every year for lung cancer if: ? You are a current smoker who has smoked for at least 30 years. ? You are a former smoker who has quit within the past 15 years.  Talk to your health care provider about your screening options, when you should start screening, and  how often you should be screened.  Colorectal Cancer  Routine colorectal cancer screening usually begins at 68 years of age and should be repeated every 5-10 years until you are 68 years old. You may need to be screened more often if early forms of precancerous polyps or small growths are found. Your health care provider may recommend screening at an earlier age if you have risk factors for colon cancer.  Your health care provider may recommend using home test kits to check for hidden blood in the stool.  A small camera at the end of a tube can be used to examine your colon (sigmoidoscopy or colonoscopy). This checks for the earliest forms of colorectal cancer.  Prostate and Testicular Cancer  Depending on your age and overall health, your health care provider may do certain tests to screen for prostate and testicular cancer.  Talk to your health care provider about any symptoms or concerns you have about testicular or prostate cancer.  Skin Cancer  Check your skin from head to toe regularly.  Tell your health care provider about any new moles or changes in moles, especially if: ? There is a change in a mole's size, shape, or color. ? You have a mole that is larger than a pencil eraser.  Always use sunscreen. Apply sunscreen liberally and repeat throughout the day.  Protect yourself by wearing long sleeves, pants, a wide-brimmed hat, and sunglasses when outside.  What should I know about heart disease, diabetes, and high blood  pressure?  If you are 26-75 years of age, have your blood pressure checked every 3-5 years. If you are 55 years of age or older, have your blood pressure checked every year. You should have your blood pressure measured twice-once when you are at a hospital or clinic, and once when you are not at a hospital or clinic. Record the average of the two measurements. To check your blood pressure when you are not at a hospital or clinic, you can use: ? An automated blood pressure machine at a pharmacy. ? A home blood pressure monitor.  Talk to your health care provider about your target blood pressure.  If you are between 62-91 years old, ask your health care provider if you should take aspirin to prevent heart disease.  Have regular diabetes screenings by checking your fasting blood sugar level. ? If you are at a normal weight and have a low risk for diabetes, have this test once every three years after the age of 67. ? If you are overweight and have a high risk for diabetes, consider being tested at a younger age or more often.  A one-time screening for abdominal aortic aneurysm (AAA) by ultrasound is recommended for men aged 65-75 years who are current or former smokers. What should I know about preventing infection? Hepatitis B If you have a higher risk for hepatitis B, you should be screened for this virus. Talk with your health care provider to find out if you are at risk for hepatitis B infection. Hepatitis C Blood testing is recommended for:  Everyone born from [birth year] through 1967.  Anyone with known risk factors for hepatitis C.  Sexually Transmitted Diseases (STDs)  You should be screened each year for STDs including gonorrhea and chlamydia if: ? You are sexually active and are younger than 68 years of age. ? You are older than 68 years of age and your health care provider tells you that you are at risk for this type of  infection. ? Your sexual activity has changed since you were last  screened and you are at an increased risk for chlamydia or gonorrhea. Ask your health care provider if you are at risk.  Talk with your health care provider about whether you are at high risk of being infected with HIV. Your health care provider may recommend a prescription medicine to help prevent HIV infection.  What else can I do?  Schedule regular health, dental, and eye exams.  Stay current with your vaccines (immunizations).  Do not use any tobacco products, such as cigarettes, chewing tobacco, and e-cigarettes. If you need help quitting, ask your health care provider.  Limit alcohol intake to no more than 2 drinks per day. One drink equals 12 ounces of beer, 5 ounces of wine, or 1 ounces of hard liquor.  Do not use street drugs.  Do not share needles.  Ask your health care provider for help if you need support or information about quitting drugs.  Tell your health care provider if you often feel depressed.  Tell your health care provider if you have ever been abused or do not feel safe at home. This information is not intended to replace advice given to you by your health care provider. Make sure you discuss any questions you have with your health care provider. Document Released: 04/08/2008 Document Revised: 06/09/2016 Document Reviewed: 07/15/2015 Elsevier Interactive Patient Education  [employer]2018 Elsevier Inc.

## 2017-04-25 NOTE — Telephone Encounter (Signed)
OK 

## 2017-05-02 ENCOUNTER — Encounter: Payer: Self-pay | Admitting: Family Medicine

## 2017-05-02 ENCOUNTER — Ambulatory Visit (INDEPENDENT_AMBULATORY_CARE_PROVIDER_SITE_OTHER): Payer: Medicare PPO | Admitting: Family Medicine

## 2017-05-02 VITALS — BP 120/70 | HR 63 | Temp 98.3°F | Wt 163.6 lb

## 2017-05-02 DIAGNOSIS — E1151 Type 2 diabetes mellitus with diabetic peripheral angiopathy without gangrene: Secondary | ICD-10-CM

## 2017-05-02 DIAGNOSIS — E785 Hyperlipidemia, unspecified: Secondary | ICD-10-CM

## 2017-05-02 DIAGNOSIS — I739 Peripheral vascular disease, unspecified: Secondary | ICD-10-CM

## 2017-05-02 DIAGNOSIS — Z1159 Encounter for screening for other viral diseases: Secondary | ICD-10-CM

## 2017-05-02 DIAGNOSIS — N529 Male erectile dysfunction, unspecified: Secondary | ICD-10-CM | POA: Diagnosis not present

## 2017-05-02 LAB — BASIC METABOLIC PANEL WITH GFR
BUN: 8 mg/dL (ref 6–23)
CO2: 28 meq/L (ref 19–32)
Calcium: 9.5 mg/dL (ref 8.4–10.5)
Chloride: 102 meq/L (ref 96–112)
Creatinine, Ser: 1.13 mg/dL (ref 0.40–1.50)
GFR: 68.5 mL/min
Glucose, Bld: 151 mg/dL — ABNORMAL HIGH (ref 70–99)
Potassium: 4.5 meq/L (ref 3.5–5.1)
Sodium: 136 meq/L (ref 135–145)

## 2017-05-02 LAB — LIPID PANEL
CHOLESTEROL: 168 mg/dL (ref 0–200)
HDL: 43.2 mg/dL (ref >39.00)
LDL Cholesterol: 102 mg/dL — ABNORMAL HIGH (ref 0–99)
NONHDL: 124.76
TRIGLYCERIDES: 112 mg/dL (ref 0.0–149.0)
Total CHOL/HDL Ratio: 4
VLDL: 22.4 mg/dL (ref 0.0–40.0)

## 2017-05-02 LAB — HEPATIC FUNCTION PANEL
ALBUMIN: 4 g/dL (ref 3.5–5.2)
ALK PHOS: 70 U/L (ref 39–117)
ALT: 8 U/L (ref 0–53)
AST: 12 U/L (ref 0–37)
Bilirubin, Direct: 0.1 mg/dL (ref 0.0–0.3)
TOTAL PROTEIN: 6.5 g/dL (ref 6.0–8.3)
Total Bilirubin: 0.5 mg/dL (ref 0.2–1.2)

## 2017-05-02 LAB — MICROALBUMIN / CREATININE URINE RATIO
Creatinine,U: 121.5 mg/dL
MICROALB UR: 2.8 mg/dL — AB (ref 0.0–1.9)
Microalb Creat Ratio: 2.3 mg/g (ref 0.0–30.0)

## 2017-05-02 LAB — HEMOGLOBIN A1C: HEMOGLOBIN A1C: 8.7 % — AB (ref 4.6–6.5)

## 2017-05-02 LAB — POCT GLYCOSYLATED HEMOGLOBIN (HGB A1C): Hemoglobin A1C: 8.2

## 2017-05-02 MED ORDER — GLIMEPIRIDE 2 MG PO TABS: ORAL_TABLET | ORAL | 3 refills | Status: DC

## 2017-05-02 MED ORDER — METFORMIN HCL 500 MG PO TABS: ORAL_TABLET | ORAL | 3 refills | Status: DC

## 2017-05-02 MED ORDER — SIMVASTATIN 80 MG PO TABS: 40.0000 mg | ORAL_TABLET | Freq: Every day | ORAL | 3 refills | Status: DC

## 2017-05-02 MED ORDER — SILDENAFIL CITRATE 20 MG PO TABS: ORAL_TABLET | ORAL | 3 refills | Status: DC

## 2017-05-02 NOTE — Progress Notes (Signed)
Subjective:     Patient ID: Adam [name], male   DOB: 02/17/1951, 66 y.o.   MRN: [id number]  HPI   Patient for routine medical follow-up. He has history of type 2 diabetes and dyslipidemia. Ongoing nicotine use. He also erectile dysfunction. He is requesting repeat trial of Viagra. No nitroglycerin use. Denies any recent chest pain. He has some fatigue with ambulation but no claudication symptoms. Has not had eye exam during the past year. Has noticed some thickening of the right great toenail and has pending follow-up with podiatry next week.  Past Medical History:  Diagnosis Date  . DIABETES MELLITUS, UNCONTROLLED 09/03/2010  . HYPERLIPIDEMIA 08/27/2010  . 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 PERIPHERAL VASCULAR DISEASE 08/27/2010  . WEIGHT LOSS 08/27/2010   Past Surgical History:  Procedure Laterality Date  . BACK SURGERY  2003  . CHOLECYSTECTOMY  1990  . KNEE SURGERY  1995   left    reports that he has been smoking Cigarettes.  He has a 50.00 pack-year smoking history. He has never used smokeless tobacco. He reports that he drinks alcohol. He reports that he does not use drugs. family history includes Cancer in his paternal grandfather; Heart disease in his father. No Known Allergies   Review of Systems  Constitutional: Negative for fatigue.  Eyes: Negative for visual disturbance.  Respiratory: Negative for cough, chest tightness and shortness of breath.   Cardiovascular: Negative for chest pain, palpitations and leg swelling.  Neurological: Negative for dizziness, syncope, weakness, light-headedness and headaches.       Objective:   Physical Exam  Constitutional: He is oriented to person, place, and time. He appears well-developed and well-nourished.  HENT:  Right Ear: External ear normal.  Left Ear: External ear normal.  Mouth/Throat: Oropharynx is clear and moist.  Eyes: Pupils are equal, round, and reactive to light.  Neck: Neck supple. No thyromegaly present.   Cardiovascular: Normal rate and regular rhythm.   Pulmonary/Chest: Effort normal and breath sounds normal. No respiratory distress. He has no wheezes. He has no rales.  Musculoskeletal: He exhibits no edema.  Neurological: He is alert and oriented to person, place, and time.  Skin:  Feet are warm to touch. No lesions. No calluses. Faint dorsalis pedis pulses bilaterally. He has some thickening of the right great toenail. Normal sensory to touch       Assessment:     #1 type 2 diabetes. A1c today 8.2% which is suboptimal control  #2 dyslipidemia  #3 ongoing nicotine use  #4 probable onychomycosis right great toe with pending podiatry referral    Plan:     -He is encouraged to stop smoking but motivation is low -Set up diabetic eye exam -Obtain lab work today including urine microalbumin, lipid, hepatic, basic metabolic panel -Refill medications for one year. -Add Amaryl 2 mg once daily -Trial of generic sildenafil 20 mg 2-5 tablets 1 hour prior to sexual activity -Routine follow-up in 4 months and sooner as needed  Adam CoveyBruce W Kobee Medlen MD [facility]

## 2017-05-03 ENCOUNTER — Other Ambulatory Visit: Payer: Self-pay | Admitting: Family Medicine

## 2017-05-03 LAB — HEPATITIS C ANTIBODY: HCV Ab: NEGATIVE

## 2017-05-03 MED ORDER — GLIMEPIRIDE 2 MG PO TABS
2.0000 mg | ORAL_TABLET | Freq: Every day | ORAL | 3 refills | Status: DC
Start: 2017-05-03 — End: 2017-05-04

## 2017-05-04 ENCOUNTER — Telehealth: Payer: Self-pay

## 2017-05-04 ENCOUNTER — Other Ambulatory Visit: Payer: Self-pay | Admitting: *Deleted

## 2017-05-04 MED ORDER — GLIMEPIRIDE 2 MG PO TABS: 2.0000 mg | ORAL_TABLET | Freq: Every day | ORAL | 3 refills | Status: DC

## 2017-05-04 NOTE — Telephone Encounter (Signed)
Received PA request for Sildenafil. PA submitted & pending. Key: [employer] [id number]

## 2017-05-09 NOTE — Telephone Encounter (Signed)
LMTCB

## 2017-05-09 NOTE — Telephone Encounter (Signed)
Let pt know.  I don't know anything else we can do.  I guess he will have to decide whether to pay out of pocket.

## 2017-05-09 NOTE — Telephone Encounter (Signed)
PA denied, medicare does not cover medications with a dx of erectile dysfunction.

## 2017-05-09 NOTE — Telephone Encounter (Signed)
Dr. Caryl NeverBurchette - FYI. Please advise. Thanks!

## 2017-05-11 NOTE — Telephone Encounter (Signed)
Spoke with pt and advised. Gave him information on [facility]. He will see where he can get the cheapest. Nothing further needed at this time.

## 2017-05-14 ENCOUNTER — Other Ambulatory Visit: Payer: Self-pay | Admitting: Family Medicine

## 2017-05-23 ENCOUNTER — Ambulatory Visit: Payer: Medicare PPO | Admitting: Podiatry

## 2017-06-06 ENCOUNTER — Ambulatory Visit: Payer: Medicare PPO | Admitting: Podiatry

## 2017-07-12 ENCOUNTER — Other Ambulatory Visit: Payer: Self-pay | Admitting: *Deleted

## 2017-07-12 MED ORDER — METFORMIN HCL 500 MG PO TABS: ORAL_TABLET | ORAL | 1 refills | Status: DC

## 2017-09-05 ENCOUNTER — Encounter: Payer: Self-pay | Admitting: Family Medicine

## 2017-09-05 ENCOUNTER — Ambulatory Visit: Payer: Medicare PPO | Admitting: Family Medicine

## 2017-09-05 VITALS — BP 102/78 | HR 67 | Temp 98.6°F | Wt 170.0 lb

## 2017-09-05 DIAGNOSIS — E1151 Type 2 diabetes mellitus with diabetic peripheral angiopathy without gangrene: Secondary | ICD-10-CM | POA: Diagnosis not present

## 2017-09-05 DIAGNOSIS — N529 Male erectile dysfunction, unspecified: Secondary | ICD-10-CM | POA: Diagnosis not present

## 2017-09-05 DIAGNOSIS — S5012XA Contusion of left forearm, initial encounter: Secondary | ICD-10-CM

## 2017-09-05 DIAGNOSIS — E785 Hyperlipidemia, unspecified: Secondary | ICD-10-CM | POA: Diagnosis not present

## 2017-09-05 LAB — POCT GLYCOSYLATED HEMOGLOBIN (HGB A1C): Hemoglobin A1C: 7

## 2017-09-05 MED ORDER — SILDENAFIL CITRATE 20 MG PO TABS: ORAL_TABLET | ORAL | 3 refills | Status: DC

## 2017-09-05 NOTE — Progress Notes (Signed)
Subjective:     Patient ID: Adam [name], male   DOB: 07/17/1951, 66 y.o.   MRN: [id number]  HPI Patient is seen for medical follow-up. Type 2 diabetes. Last A1c 8.2%. He added back low-dose glimepiride. No hypoglycemia. Not monitoring blood sugars regulate. No polyuria or polydipsia.  Other medications reviewed. He has hyperlipidemia treated with simvastatin. No myalgias. Had recent labs last visit and lipids fairly well controlled.  Recent fall with hematoma involving left elbow. He went outside and had x-rays no fractures noted. Hematoma apparently slowly decreasing in size. No limitations with use. No pain.  Past Medical History:  Diagnosis Date  . DIABETES MELLITUS, UNCONTROLLED 09/03/2010  . HYPERLIPIDEMIA 08/27/2010  . 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 PERIPHERAL VASCULAR DISEASE 08/27/2010  . WEIGHT LOSS 08/27/2010   Past Surgical History:  Procedure Laterality Date  . BACK SURGERY  2003  . CHOLECYSTECTOMY  1990  . KNEE SURGERY  1995   left    reports that he has been smoking cigarettes.  He has a 50.00 pack-year smoking history. he has never used smokeless tobacco. He reports that he drinks alcohol. He reports that he does not use drugs. family history includes Cancer in his paternal grandfather; Heart disease in his father. No Known Allergies   Review of Systems  Constitutional: Negative for fatigue.  Eyes: Negative for visual disturbance.  Respiratory: Negative for cough, chest tightness and shortness of breath.   Cardiovascular: Negative for chest pain, palpitations and leg swelling.  Neurological: Negative for dizziness, syncope, weakness, light-headedness and headaches.       Objective:   Physical Exam  Constitutional: He is oriented to person, place, and time. He appears well-developed and well-nourished.  HENT:  Right Ear: External ear normal.  Left Ear: External ear normal.  Mouth/Throat: Oropharynx is clear and moist.  Eyes: Pupils are equal, round,  and reactive to light.  Neck: Neck supple. No thyromegaly present.  Cardiovascular: Normal rate and regular rhythm.  Pulmonary/Chest: Effort normal and breath sounds normal. No respiratory distress. He has no wheezes. He has no rales.  Musculoskeletal: He exhibits no edema.  Patient has a fairly large hematoma involving his left lateral proximal elbow region. Olecranon bursa region is normal. No erythema. No warmth.  Neurological: He is alert and oriented to person, place, and time.       Assessment:     #1 type 2 diabetes improved with A1c 7.0% today  # 2 dyslipidemia on simvastatin  #3 erectile dysfunction. Patient requesting refills of sildenafil  #4 hematoma left proximal lateral forearm-no signs of secondary infection or other complication    Plan:     -Refill sildenafil for as needed use. No contraindications such as nitroglycerin use -Continue current medications and routine follow-up in 6 months -Flu vaccine advised and patient declines -Patient advised that hematoma may take several months to fully resolve  Adam CoveyBruce W Shiza Thelen MD [facility]

## 2017-10-09 DIAGNOSIS — M19011 Primary osteoarthritis, right shoulder: Secondary | ICD-10-CM | POA: Diagnosis not present

## 2017-10-09 DIAGNOSIS — S32039A Unspecified fracture of third lumbar vertebra, initial encounter for closed fracture: Secondary | ICD-10-CM | POA: Diagnosis not present

## 2017-10-09 DIAGNOSIS — J0100 Acute maxillary sinusitis, unspecified: Secondary | ICD-10-CM | POA: Diagnosis not present

## 2017-10-09 DIAGNOSIS — Z043 Encounter for examination and observation following other accident: Secondary | ICD-10-CM | POA: Diagnosis not present

## 2017-10-09 DIAGNOSIS — S0003XA Contusion of scalp, initial encounter: Secondary | ICD-10-CM | POA: Diagnosis not present

## 2017-10-09 DIAGNOSIS — S42291A Other displaced fracture of upper end of right humerus, initial encounter for closed fracture: Secondary | ICD-10-CM | POA: Diagnosis not present

## 2017-10-09 DIAGNOSIS — M4856XA Collapsed vertebra, not elsewhere classified, lumbar region, initial encounter for fracture: Secondary | ICD-10-CM | POA: Diagnosis not present

## 2017-10-09 DIAGNOSIS — S0990XA Unspecified injury of head, initial encounter: Secondary | ICD-10-CM | POA: Diagnosis not present

## 2017-10-09 DIAGNOSIS — M25511 Pain in right shoulder: Secondary | ICD-10-CM | POA: Diagnosis not present

## 2017-10-09 DIAGNOSIS — N281 Cyst of kidney, acquired: Secondary | ICD-10-CM | POA: Diagnosis not present

## 2017-10-09 DIAGNOSIS — S42351A Displaced comminuted fracture of shaft of humerus, right arm, initial encounter for closed fracture: Secondary | ICD-10-CM | POA: Diagnosis not present

## 2017-10-09 DIAGNOSIS — N200 Calculus of kidney: Secondary | ICD-10-CM | POA: Diagnosis not present

## 2017-10-09 DIAGNOSIS — M4802 Spinal stenosis, cervical region: Secondary | ICD-10-CM | POA: Diagnosis not present

## 2018-04-26 ENCOUNTER — Ambulatory Visit (INDEPENDENT_AMBULATORY_CARE_PROVIDER_SITE_OTHER): Payer: Medicare PPO

## 2018-04-26 VITALS — BP 120/80 | HR 74 | Ht 69.0 in | Wt 157.0 lb

## 2018-04-26 DIAGNOSIS — Z87891 Personal history of nicotine dependence: Secondary | ICD-10-CM

## 2018-04-26 DIAGNOSIS — S52591A Other fractures of lower end of right radius, initial encounter for closed fracture: Secondary | ICD-10-CM | POA: Diagnosis not present

## 2018-04-26 DIAGNOSIS — E119 Type 2 diabetes mellitus without complications: Secondary | ICD-10-CM | POA: Diagnosis not present

## 2018-04-26 DIAGNOSIS — Z0000 Encounter for general adult medical examination without abnormal findings: Secondary | ICD-10-CM

## 2018-04-26 LAB — POCT GLYCOSYLATED HEMOGLOBIN (HGB A1C): HEMOGLOBIN A1C: 6.4 % — AB (ref 4.0–5.6)

## 2018-04-26 LAB — HEMOGLOBIN A1C: HEMOGLOBIN A1C: 6.4

## 2018-04-26 NOTE — Patient Instructions (Addendum)
Adam [name] , Thank you for taking time to come for your Medicare Wellness Visit. I appreciate your ongoing commitment to your health goals. Please review the following plan we discussed and let me know if I can assist you in the future.   Please schedule your eye exam  Will go to someone locally.  Can go per [facility]   Make an annual apt to fup with Dr. Elease Hashimoto after November 13   Agrees to A1c today- A1c was 6.4   Try the ensure for a meal replacement in the am or late morning!   Racheal to order your Colo-guard for Dr. Sheldon Silvan. You should be hearing from [employer] and receive a kit at your door.   Shingrix is a vaccine for the prevention of Shingles in Adults 50 and older.  If you are on Medicare, the shingrix is covered under your Part D plan, so you will take both of the vaccines in the series at your pharmacy. Please check with your benefits regarding applicable copays or out of pocket expenses.  The Shingrix is given in 2 vaccines approx 8 weeks apart. You must receive the 2nd dose prior to 6 months from receipt of the first. Please have the pharmacist print out you Immunization  dates for our office records   A Tetanus is recommended every 10 years. Medicare covers a tetanus if you have a cut or wound; otherwise, there may be a charge. If you had not had a tetanus with pertusses, known as the Tdap, you can take this anytime.   Colonoscopy - declines for now;  Medicare Part B will cover the CologuardTM test once every three years for beneficiaries who meet all of the following criteria:  Age 23 to 46 years,  Asymptomatic (no signs or symptoms of colorectal disease including but not limited to lower gastrointestinal pain, blood in stool, positive guaiac fecal occult blood test or fecal immunochemical test), and  At average risk of developing colorectal cancer (no personal history of adenomatous polyps, colorectal cancer, or inflammatory bowel disease, including Crohn's Disease  and ulcerative colitis; no family history of colorectal cancers or adenomatous polyps, familial adenomatous polyposis, or hereditary nonpolyposis colorectal cancer).   Prevention of falls: Remove rugs or any tripping hazards in the home Use Non slip mats in bathtubs and showers Placing grab bars next to the toilet and or shower Placing handrails on both sides of the stair way Adding extra lighting in the home.   Personal safety issues reviewed:  1. Consider starting a community watch program per [facility] 2.  Changes batteries is smoke detector and/or carbon monoxide detector  3.  If you have firearms; keep them in a safe place 4.  Wear protection when in the sun; Always wear sunscreen or a hat; It is good to have your doctor check your skin annually or review any new areas of concern 5. Driving safety; Keep in the right lane; stay 3 car lengths behind the car in front of you on the highway; look 3 times prior to pulling out; carry your cell phone everywhere you go!    Learn about the Yellow Dot program:  The program allows first responders at your emergency to have access to who your physician is, as well as your medications and medical conditions.  Citizens requesting the Yellow Dot Packages should contact Master Corporal Nunzio Cobbs at the [facility] [phone] for the first week of the program and beginning the week after Easter  citizens should contact their Scientist, physiological.       These are the goals we discussed: Goals    . 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 Tetanus Vaccine  11/27/1967  . Colon Cancer Screening  11/26/1998  . Eye exam for diabetics  05/10/2015  . Hemoglobin A1C  03/05/2018  . Flu Shot  09/05/2018*  . Complete foot exam   05/02/2018  . Urine Protein Check  05/02/2018  .  Hepatitis  C: One time screening is recommended by Center for Disease Control  (CDC) for  adults born from [birth year] through 1965.   Completed  . Pneumonia vaccines  Completed  *Topic was postponed. The date shown is not the original due date.    Health Maintenance, Male A healthy lifestyle and preventive care is important for your health and wellness. Ask your health care provider about what schedule of regular examinations is right for you. What should I know about weight and diet? Eat a Healthy Diet  Eat plenty of vegetables, fruits, whole grains, low-fat dairy products, and lean protein.  Do not eat a lot of foods high in solid fats, added sugars, or salt.  Maintain a Healthy Weight Regular exercise can help you achieve or maintain a healthy weight. You should:  Do at least 150 minutes of exercise each week. The exercise should increase your heart rate and make you sweat (moderate-intensity exercise).  Do strength-training exercises at least twice a week.  Watch Your Levels of Cholesterol and Blood Lipids  Have your blood tested for lipids and cholesterol every 5 years starting at 69 years of age. If you are at high risk for heart disease, you should start having your blood tested when you are 70 years old. You may need to have your cholesterol levels checked more often if: ? Your lipid or cholesterol levels are high. ? You are older than 68 years of age. ? You are at high risk for heart disease.  What should I know about cancer screening? Many types of cancers can be detected early and may often be prevented. Lung Cancer  You should be screened every year for lung cancer if: ? You are a current smoker who has smoked for at least 30 years. ? You are a former smoker who has quit within the past 15 years.  Talk to your health care provider about your screening options, when you should start screening, and how often you should be screened.  Colorectal Cancer  Routine colorectal cancer  screening usually begins at 69 years of age and should be repeated every 5-10 years until you are 69 years old. You may need to be screened more often if early forms of precancerous polyps or small growths are found. Your health care provider may recommend screening at an earlier age if you have risk factors for colon cancer.  Your health care provider may recommend using home test kits to check for hidden blood in the stool.  A small camera at the end of a tube can be used to examine your colon (sigmoidoscopy or colonoscopy). This checks for the earliest forms of colorectal cancer.  Prostate and Testicular Cancer  Depending on your age and overall health, your health care provider may do certain tests to screen for prostate and testicular cancer.  Talk to your health care provider about any symptoms or concerns you have about testicular or prostate cancer.  Skin Cancer  Check your skin from head to toe regularly.  Tell your health care provider about any new moles or changes in moles, especially if: ? There is a change in a mole's size, shape, or color. ? You have a mole that is larger than a pencil eraser.  Always use sunscreen. Apply sunscreen liberally and repeat throughout the day.  Protect yourself by wearing long sleeves, pants, a wide-brimmed hat, and sunglasses when outside.  What should I know about heart disease, diabetes, and high blood pressure?  If you are 6-94 years of age, have your blood pressure checked every 3-5 years. If you are 84 years of age or older, have your blood pressure checked every year. You should have your blood pressure measured twice-once when you are at a hospital or clinic, and once when you are not at a hospital or clinic. Record the average of the two measurements. To check your blood pressure when you are not at a hospital or clinic, you can use: ? An automated blood pressure machine at a pharmacy. ? A home blood pressure monitor.  Talk to your  health care provider about your target blood pressure.  If you are between 68-60 years old, ask your health care provider if you should take aspirin to prevent heart disease.  Have regular diabetes screenings by checking your fasting blood sugar level. ? If you are at a normal weight and have a low risk for diabetes, have this test once every three years after the age of 91. ? If you are overweight and have a high risk for diabetes, consider being tested at a younger age or more often.  A one-time screening for abdominal aortic aneurysm (AAA) by ultrasound is recommended for men aged 26-75 years who are current or former smokers. What should I know about preventing infection? Hepatitis B If you have a higher risk for hepatitis B, you should be screened for this virus. Talk with your health care provider to find out if you are at risk for hepatitis B infection. Hepatitis C Blood testing is recommended for:  Everyone born from [birth year] through 1965.  Anyone with known risk factors for hepatitis C.  Sexually Transmitted Diseases (STDs)  You should be screened each year for STDs including gonorrhea and chlamydia if: ? You are sexually active and are younger than 69 years of age. ? You are older than 69 years of age and your health care provider tells you that you are at risk for this type of infection. ? Your sexual activity has changed since you were last screened and you are at an increased risk for chlamydia or gonorrhea. Ask your health care provider if you are at risk.  Talk with your health care provider about whether you are at high risk of being infected with HIV. Your health care provider may recommend a prescription medicine to help prevent HIV infection.  What else can I do?  Schedule regular health, dental, and eye exams.  Stay current with your vaccines (immunizations).  Do not use any tobacco products, such as cigarettes, chewing tobacco, and e-cigarettes. If you need help  quitting, ask your health care provider.  Limit alcohol intake to no more than 2 drinks per day. One drink equals 12 ounces of beer, 5 ounces of wine, or 1 ounces of hard liquor.  Do not use street drugs.  Do  not share needles.  Ask your health care provider for help if you need support or information about quitting drugs.  Tell your health care provider if you often feel depressed.  Tell your health care provider if you have ever been abused or do not feel safe at home. This information is not intended to replace advice given to you by your health care provider. Make sure you discuss any questions you have with your health care provider. Document Released: 04/08/2008 Document Revised: 06/09/2016 Document Reviewed: 07/15/2015 Elsevier Interactive Patient Education  [employer].
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
## 2018-05-25 ENCOUNTER — Other Ambulatory Visit: Payer: Self-pay | Admitting: Family Medicine

## 2018-05-31 ENCOUNTER — Telehealth: Payer: Self-pay | Admitting: Family Medicine

## 2018-05-31 NOTE — Telephone Encounter (Signed)
Left message on machine for Adam [name] to return our call.  CRM created

## 2018-05-31 NOTE — Telephone Encounter (Signed)
Copied from CRM [id number]. Topic: Referral - Request >> May 31, 2018  1:45 PM Waymon AmatoBurton, Donna F wrote: Huntley DecSara [facility] the [facility] is calling to get the order changed for this pt bone density so that insurance will cover this procedure  Best number [phone] ext [phone]

## 2018-06-05 NOTE — Telephone Encounter (Signed)
I have called and lmom for Adam [name] to call us back about the order for the bone denisty.

## 2018-06-06 ENCOUNTER — Telehealth: Payer: Self-pay | Admitting: Family Medicine

## 2018-06-06 DIAGNOSIS — Z8781 Personal history of (healed) traumatic fracture: Secondary | ICD-10-CM

## 2018-06-06 NOTE — Telephone Encounter (Signed)
Copied from CRM [id number]. Topic: General - Other >> Jun 06, 2018  8:23 AM Percival SpanishKennedy, Cheryl W wrote:  Huntley DecSara with the [facility] call to say she need the DX changed in Epic for the Bone Density because the insurance will not cover that   [facility] [phone] ext [phone]

## 2018-06-06 NOTE — Telephone Encounter (Signed)
Left message on machine for Huntley DecSara to return our call.  Dx?    CRM created

## 2018-06-07 NOTE — Telephone Encounter (Signed)
Spoke with Huntley DecSara and new order placed

## 2018-08-14 ENCOUNTER — Ambulatory Visit
Admission: RE | Admit: 2018-08-14 | Discharge: 2018-08-14 | Disposition: A | Discharge status: Home / Self Care | Payer: Medicare PPO | Source: Ambulatory Visit | Attending: Family Medicine | Admitting: Family Medicine

## 2018-08-14 DIAGNOSIS — Z8781 Personal history of (healed) traumatic fracture: Secondary | ICD-10-CM

## 2018-08-22 ENCOUNTER — Other Ambulatory Visit: Payer: Self-pay | Admitting: Family Medicine

## 2018-08-23 ENCOUNTER — Ambulatory Visit: Payer: Medicare PPO | Admitting: Family Medicine

## 2018-08-23 ENCOUNTER — Other Ambulatory Visit: Payer: Self-pay

## 2018-08-23 ENCOUNTER — Encounter: Payer: Self-pay | Admitting: Family Medicine

## 2018-08-23 VITALS — BP 138/86 | HR 74 | Temp 98.1°F | Wt 160.7 lb

## 2018-08-23 DIAGNOSIS — E1151 Type 2 diabetes mellitus with diabetic peripheral angiopathy without gangrene: Secondary | ICD-10-CM

## 2018-08-23 DIAGNOSIS — E785 Hyperlipidemia, unspecified: Secondary | ICD-10-CM

## 2018-08-23 DIAGNOSIS — M810 Age-related osteoporosis without current pathological fracture: Secondary | ICD-10-CM | POA: Insufficient documentation

## 2018-08-23 LAB — TSH: TSH: 0.93 u[IU]/mL (ref 0.35–4.50)

## 2018-08-23 LAB — COMPREHENSIVE METABOLIC PANEL
ALBUMIN: 4.2 g/dL (ref 3.5–5.2)
ALT: 9 U/L (ref 0–53)
AST: 12 U/L (ref 0–37)
Alkaline Phosphatase: 59 U/L (ref 39–117)
BUN: 12 mg/dL (ref 6–23)
CHLORIDE: 105 meq/L (ref 96–112)
CO2: 27 mEq/L (ref 19–32)
Calcium: 9.7 mg/dL (ref 8.4–10.5)
Creatinine, Ser: 1.16 mg/dL (ref 0.40–1.50)
GFR: 66.21 mL/min (ref >60.00)
Glucose, Bld: 152 mg/dL — ABNORMAL HIGH (ref 70–99)
POTASSIUM: 4.9 meq/L (ref 3.5–5.1)
Sodium: 141 mEq/L (ref 135–145)
Total Bilirubin: 0.5 mg/dL (ref 0.2–1.2)
Total Protein: 6.6 g/dL (ref 6.0–8.3)

## 2018-08-23 LAB — LIPID PANEL
CHOL/HDL RATIO: 5
Cholesterol: 247 mg/dL — ABNORMAL HIGH (ref 0–200)
HDL: 49.3 mg/dL (ref >39.00)
LDL CALC: 174 mg/dL — AB (ref 0–99)
NonHDL: 197.61
TRIGLYCERIDES: 119 mg/dL (ref 0.0–149.0)
VLDL: 23.8 mg/dL (ref 0.0–40.0)

## 2018-08-23 LAB — HEMOGLOBIN A1C: Hgb A1c MFr Bld: 6.7 % — ABNORMAL HIGH (ref 4.6–6.5)

## 2018-08-23 LAB — TESTOSTERONE: Testosterone: 423.52 ng/dL (ref 300.00–890.00)

## 2018-08-23 LAB — VITAMIN D 25 HYDROXY (VIT D DEFICIENCY, FRACTURES): VITD: 25.76 ng/mL — ABNORMAL LOW (ref 30.00–100.00)

## 2018-08-23 NOTE — Patient Instructions (Signed)
Osteoporosis Osteoporosis is the thinning and loss of density in the bones. Osteoporosis makes the bones more brittle, fragile, and likely to break (fracture). Over time, osteoporosis can cause the bones to become so weak that they fracture after a simple fall. The bones most likely to fracture are the bones in the hip, wrist, and spine. What are the causes? The exact cause is not known. What increases the risk? Anyone can develop osteoporosis. You may be at greater risk if you have a family history of the condition or have poor nutrition. You may also have a higher risk if you are:  Male.  50 years old or older.  A smoker.  Not physically active.  [place] or Asian.  Slender.  What are the signs or symptoms? A fracture might be the first sign of the disease, especially if it results from a fall or injury that would not usually cause a bone to break. Other signs and symptoms include:  Low back and neck pain.  Stooped posture.  Height loss.  How is this diagnosed? To make a diagnosis, your health care provider may:  Take a medical history.  Perform a physical exam.  Order tests, such as: ? A bone mineral density test. ? A dual-energy X-ray absorptiometry test.  How is this treated? The goal of osteoporosis treatment is to strengthen your bones to reduce your risk of a fracture. Treatment may involve:  Making lifestyle changes, such as: ? Eating a diet rich in calcium. ? Doing weight-bearing and muscle-strengthening exercises. ? Stopping tobacco use. ? Limiting alcohol intake.  Taking medicine to slow the process of bone loss or to increase bone density.  Monitoring your levels of calcium and vitamin D.  Follow these instructions at home:  Include calcium and vitamin D in your diet. Calcium is important for bone health, and vitamin D helps the body absorb calcium.  Perform weight-bearing and muscle-strengthening exercises as directed by your health care  provider.  Do not use any tobacco products, including cigarettes, chewing tobacco, and electronic cigarettes. If you need help quitting, ask your health care provider.  Limit your alcohol intake.  Take medicines only as directed by your health care provider.  Keep all follow-up visits as directed by your health care provider. This is important.  Take precautions at home to lower your risk of falling, such as: ? Keeping rooms well lit and clutter free. ? Installing safety rails on stairs. ? Using rubber mats in the bathroom and other areas that are often wet or slippery. Get help right away if: You fall or injure yourself. This information is not intended to replace advice given to you by your health care provider. Make sure you discuss any questions you have with your health care provider. Document Released: 07/21/2005 Document Revised: 03/15/2016 Document Reviewed: 03/21/2014 Elsevier Interactive Patient Education  2018 Elsevier Inc.  
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
## 2018-08-24 ENCOUNTER — Other Ambulatory Visit: Payer: Self-pay | Admitting: *Deleted

## 2018-08-24 MED ORDER — ALENDRONATE SODIUM 70 MG PO TABS: 70.0000 mg | ORAL_TABLET | ORAL | 5 refills | Status: DC

## 2018-08-24 MED ORDER — SIMVASTATIN 80 MG PO TABS: 40.0000 mg | ORAL_TABLET | Freq: Every day | ORAL | 3 refills | Status: DC

## 2018-08-26 ENCOUNTER — Other Ambulatory Visit: Payer: Self-pay | Admitting: Family Medicine

## 2018-09-08 ENCOUNTER — Encounter: Payer: Self-pay | Admitting: Family Medicine

## 2018-11-14 ENCOUNTER — Other Ambulatory Visit: Payer: Self-pay | Admitting: Family Medicine

## 2019-02-28 ENCOUNTER — Ambulatory Visit: Payer: Self-pay | Admitting: Family Medicine

## 2019-02-28 ENCOUNTER — Ambulatory Visit (INDEPENDENT_AMBULATORY_CARE_PROVIDER_SITE_OTHER): Payer: Medicare Other | Admitting: Family Medicine

## 2019-02-28 ENCOUNTER — Other Ambulatory Visit: Payer: Self-pay

## 2019-02-28 DIAGNOSIS — E1151 Type 2 diabetes mellitus with diabetic peripheral angiopathy without gangrene: Secondary | ICD-10-CM | POA: Diagnosis not present

## 2019-02-28 DIAGNOSIS — M810 Age-related osteoporosis without current pathological fracture: Secondary | ICD-10-CM

## 2019-02-28 DIAGNOSIS — R3915 Urgency of urination: Secondary | ICD-10-CM | POA: Diagnosis not present

## 2019-02-28 DIAGNOSIS — E785 Hyperlipidemia, unspecified: Secondary | ICD-10-CM | POA: Diagnosis not present

## 2019-02-28 MED ORDER — ALENDRONATE SODIUM 70 MG PO TABS: 70.0000 mg | ORAL_TABLET | ORAL | 3 refills | Status: DC

## 2019-02-28 MED ORDER — GLIMEPIRIDE 2 MG PO TABS: ORAL_TABLET | ORAL | 3 refills | Status: DC

## 2019-02-28 NOTE — Progress Notes (Signed)
Patient ID: Adam [name], male   DOB: 02/09/1951, 68 y.o.   MRN: [id number]  This visit type was conducted due to national recommendations for restrictions regarding the COVID-19 pandemic in an effort to limit this patient's exposure and mitigate transmission in our community.   Virtual Visit via Telephone Note  I connected with Adam [name] on 02/28/19 at  3:30 PM EDT by telephone and verified that I am speaking with the correct person using two identifiers.   I discussed the limitations, risks, security and privacy concerns of performing an evaluation and management service by telephone and the availability of in person appointments. I also discussed with the patient that there may be a patient responsible charge related to this service. The patient expressed understanding and agreed to proceed.  Location patient: home Location provider: work or home office Participants present for the call: patient, provider Patient did not have a visit in the prior 7 days to address this/these issue(s).   History of Present Illness: Patient has chronic problems including hypertension, hyperlipidemia, type 2 diabetes, osteoporosis.  He was just started on Fosamax last fall.  He unfortunately took this for a month and thought he had no further refills.  He denied any side effects from taking this.  DEXA scan showed T score -2.8.  He has type 2 diabetes.  Last A1c 6.7%.  Not monitoring blood sugars regularly.  He takes Amaryl and Metformin.  No recent hypoglycemic symptoms.  Needs refills of Amaryl.  He also had low vitamin D and is now taking replacement.  He has hyperlipidemia and went back on simvastatin last fall.  No myalgias.  He does have history of some peripheral vascular disease.  History of ongoing nicotine use.  Patient also relates some slightly slower urinary stream.  No nocturia.  He is also having some urgency.  He states he feels he is able to empty his bladder fully but stream is just little  slower.  No stress incontinence.  No burning with urination.   Observations/Objective: Patient sounds cheerful and well on the phone. I do not appreciate any SOB. Speech and thought processing are grossly intact. Patient reported vitals:  Assessment and Plan: #1 type 2 diabetes.  History of good control.  Not regularly monitoring -Refill Amaryl -We will plan on A1c when he is back in the office in a few months  #2 osteoporosis.  Recently went on Fosamax but did not refill prescription after taking the first month -Sent in refills for Fosamax -Continue regular calcium and vitamin D - repeat DEXA scan within 1 year  #3 dyslipidemia treated with simvastatin.  Needs follow-up lipids -We will plan fasting lipid and hepatic panel when comes in in a few months for follow-up  #4 urine urgency.  He also describes some mild slow stream but no nocturia.  Symptoms are relatively mild.  Increased caffeine use -Gradually reduce caffeine first -Recommend prostate exam if symptoms not improving with reduction of caffeine  Follow Up Instructions:  -We will plan office follow-up 3 to 4 months    I did not refer this patient for an OV in the next 24 hours for this/these issue(s).  I discussed the assessment and treatment plan with the patient. The patient was provided an opportunity to ask questions and all were answered. The patient agreed with the plan and demonstrated an understanding of the instructions.   The patient was advised to call back or seek an in-person evaluation if the symptoms worsen or if  the condition fails to improve as anticipated.  I provided 25 minutes of non-face-to-face time during this encounter.   Evelena Peat, MD

## 2019-04-30 ENCOUNTER — Ambulatory Visit: Payer: Medicare PPO

## 2019-06-18 ENCOUNTER — Other Ambulatory Visit: Payer: Self-pay | Admitting: Family Medicine

## 2019-06-19 NOTE — Telephone Encounter (Signed)
Need to clarify direction on prescription for metformin 500mg . Please confirm [phone]

## 2019-06-19 NOTE — Telephone Encounter (Signed)
Called pharmacy and they stated that the patient thought that he is supposed to be taking 2 metformin twice daily. The last instructions I see for patient are 2 tablets daily. Pharmacy wanted to double check to be sure.  Please advise.

## 2019-06-19 NOTE — Telephone Encounter (Signed)
See request °

## 2019-06-19 NOTE — Telephone Encounter (Signed)
We have to go by chart unless pt indicates otherwise.

## 2019-06-20 MED ORDER — METFORMIN HCL 500 MG PO TABS: ORAL_TABLET | ORAL | 0 refills | Status: DC

## 2019-06-20 NOTE — Addendum Note (Signed)
Addended by: Westley Hummer B on: 06/20/2019 12:15 PM   Modules accepted: Orders

## 2019-06-20 NOTE — Telephone Encounter (Signed)
Spoke with patient and he is taking 2 tabs twice daily. Refill sent.

## 2019-07-03 MED ORDER — DOLACET PO
0.10 | ORAL | Status: DC
Start: ? — End: 2019-07-03

## 2019-07-03 MED ORDER — Medication
5.00 | Status: DC
Start: ? — End: 2019-07-03

## 2019-07-03 MED ORDER — Medication
12.00 | Status: DC
Start: ? — End: 2019-07-03

## 2019-07-03 MED ORDER — GENTAK 0.3 % OP SOLN
1.00 | OPHTHALMIC | Status: DC
Start: 2019-07-03 — End: 2019-07-03

## 2019-07-03 MED ORDER — DEXTROSE 50 % IV SOLN
50.00 | INTRAVENOUS | Status: DC
Start: ? — End: 2019-07-03

## 2019-07-03 MED ORDER — COMPOUND W FREEZE OFF EX AERO
0.00 | INHALATION_SPRAY | CUTANEOUS | Status: DC
Start: 2019-07-02 — End: 2019-07-03

## 2019-07-03 MED ORDER — MP TRI-FED COLD 2.5-60 MG PO TABS
25.00 | ORAL_TABLET | ORAL | Status: DC
Start: ? — End: 2019-07-03

## 2019-07-03 MED ORDER — ACETAMINOPHEN 325 MG PO TABS
650.00 | ORAL_TABLET | ORAL | Status: DC
Start: ? — End: 2019-07-03

## 2019-07-03 MED ORDER — Medication
2.00 | Status: DC
Start: 2019-07-03 — End: 2019-07-03

## 2019-07-03 MED ORDER — CALCIUM-VITAMIN D
1200.00 | Status: DC
Start: 2019-07-02 — End: 2019-07-03

## 2019-07-03 MED ORDER — PHENYLEPHRINE-GUAIFENESIN 20-375 MG PO CP12
10.00 | ORAL_CAPSULE | ORAL | Status: DC
Start: ? — End: 2019-07-03

## 2019-07-03 MED ORDER — ASTELIN 137 MCG/SPRAY NA SOLN
40.00 | NASAL | Status: DC
Start: 2019-07-03 — End: 2019-07-03

## 2019-07-03 MED ORDER — EASY SLEEP 25 MG OR TABS
1.00 | ORAL_TABLET | ORAL | Status: DC
Start: ? — End: 2019-07-03

## 2019-07-03 MED ORDER — ACCU-PRO PUMP SET/VENT MISC
2.50 | Status: DC
Start: ? — End: 2019-07-03

## 2019-07-03 MED ORDER — OLANZAPINE-FLUOXETINE HCL 6-50 MG PO CAPS
6.00 | ORAL_CAPSULE | ORAL | Status: DC
Start: ? — End: 2019-07-03

## 2019-07-03 MED ORDER — FOSPHENYTOIN SODIUM 50 MG PE/ML IJ SOLN
INTRAMUSCULAR | Status: DC
Start: ? — End: 2019-07-03

## 2019-07-03 MED ORDER — VICON FORTE PO CAPS
17.00 | ORAL_CAPSULE | ORAL | Status: DC
Start: 2019-07-03 — End: 2019-07-03

## 2019-07-03 MED ORDER — METHADOSE 5 MG PO TABS
4.00 | ORAL_TABLET | ORAL | Status: DC
Start: ? — End: 2019-07-03

## 2019-07-03 MED ORDER — Medication
500.00 | Status: DC
Start: 2019-07-03 — End: 2019-07-03

## 2019-07-03 MED ORDER — ONDANSETRON HCL 4 MG/2ML IJ SOLN
4.00 | INTRAMUSCULAR | Status: DC
Start: ? — End: 2019-07-03

## 2019-07-03 MED ORDER — DICLOXACILLIN SODIUM 62.5 MG/5ML PO SUSR
10.00 | ORAL | Status: DC
Start: ? — End: 2019-07-03

## 2019-07-04 ENCOUNTER — Inpatient Hospital Stay: Payer: Medicare Other | Admitting: Family Medicine

## 2019-10-01 ENCOUNTER — Ambulatory Visit (INDEPENDENT_AMBULATORY_CARE_PROVIDER_SITE_OTHER): Payer: Medicare Other

## 2019-10-01 VITALS — Ht 72.0 in | Wt 160.0 lb

## 2019-10-01 DIAGNOSIS — Z0000 Encounter for general adult medical examination without abnormal findings: Secondary | ICD-10-CM | POA: Diagnosis not present

## 2019-10-01 NOTE — Progress Notes (Signed)
This visit is being conducted via phone call due to the COVID-19 pandemic. This patient has given me verbal consent via phone to conduct this visit, patient states they are participating from their home address. Some vital signs may be absent or patient reported.   Patient identification: identified by name, DOB, and current address.  Location provider:  [facility], Office Persons participating in the virtual visit:  Adam [name] and Nathaniel Man, LPN.   Subjective:   Adam [name] is a 67 y.o. male who presents for Medicare Annual/Subsequent preventive examination.  Review of Systems:   Cardiac Risk Factors include: advanced age (>34men, >39 women);diabetes mellitus;smoking/ tobacco exposure;male gender     Objective:    Vitals: Ht 6' (1.829 m)   Wt 160 lb (72.6 kg)   BMI 21.70 kg/m   Body mass index is 21.7 kg/m.  Advanced Directives 10/01/2019 04/26/2018 04/25/2017 01/20/2015 11/18/2014  Does Patient Have a Medical Advance Directive? No No No No No  Would patient like information on creating a medical advance directive? Yes (MAU/Ambulatory/Procedural Areas - Information given) - No - Patient declined No - patient declined information No - patient declined information    Tobacco Social History   Tobacco Use  Smoking Status Current Every Day Smoker  . Packs/day: 1.00  . Years: 50.00  . Pack years: 50.00  . Types: Cigarettes  Smokeless Tobacco Never Used     Ready to quit: Not Answered Counseling given: Not Answered   Clinical Intake:  Pre-visit preparation completed: Yes  Pain : No/denies pain     Nutritional Status: BMI of 19-24  Normal Nutritional Risks: None Diabetes: Yes CBG done?: No(N/A due to telephone visit) Did pt. bring in CBG monitor from home?: No(N/A due to telephone visit)  How often do you need to have someone help you when you read instructions, pamphlets, or other written materials from your doctor or pharmacy?: 1 - Never What is the last  grade level you completed in school?: 4 years college  Interpreter Needed?: No  Information entered by :: Nathaniel Man, LPN.  Past Medical History:  Diagnosis Date  . DIABETES MELLITUS, UNCONTROLLED 09/03/2010  . HYPERLIPIDEMIA 08/27/2010  . 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 PERIPHERAL VASCULAR DISEASE 08/27/2010  . WEIGHT LOSS 08/27/2010   Past Surgical History:  Procedure Laterality Date  . BACK SURGERY  2003  . CHOLECYSTECTOMY  1990  . KNEE SURGERY  1995   left   Family History  Problem Relation Age of Onset  . Heart disease Father   . Cancer Paternal Grandfather        lung   Social History   Socioeconomic History  . Marital status: Single    Spouse name: Not on file  . Number of children: 3  . Years of education: 4 years college  . Highest education level: Bachelor's degree (e.g., BA, AB, BS)  Occupational History  . Not on file  Social Needs  . Financial resource strain: Somewhat hard  . Food insecurity    Worry: Never true    Inability: Never true  . Transportation needs    Medical: No    Non-medical: No  Tobacco Use  . Smoking status: Current Every Day Smoker    Packs/day: 1.00    Years: 50.00    Pack years: 50.00    Types: Cigarettes  . Smokeless tobacco: Never Used  Substance and Sexual Activity  . Alcohol use: Not Currently    Alcohol/week: 0.0 standard drinks  Comment: occasional  . Drug use: No  . Sexual activity: Not on file  Lifestyle  . Physical activity    Days per week: 7 days    Minutes per session: 120 min  . Stress: Not at all  Relationships  . Social connections    Talks on phone: More than three times a week    Gets together: Not on file    Attends religious service: Not on file    Active member of club or organization: Not on file    Attends meetings of clubs or organizations: Not on file    Relationship status: Not on file  Other Topics Concern  . Not on file  Social History Narrative  . Not on file     Outpatient Encounter Medications as of 10/01/2019  Medication Sig  . aspirin 325 MG tablet Take 650 mg by mouth daily.   . metFORMIN (GLUCOPHAGE) 500 MG tablet Take 2 tablets by mouth twice daily  . sildenafil (REVATIO) 20 MG tablet Take 2 to 5 tablets one hour prior to sexual activity.  . simvastatin (ZOCOR) 80 MG tablet Take 0.5 tablets (40 mg total) by mouth daily at 6 PM.  . alendronate (FOSAMAX) 70 MG tablet Take 1 tablet (70 mg total) by mouth every 7 (seven) days. Take with a full glass of water on an empty stomach. (Patient not taking: Reported on 10/01/2019)  . glimepiride (AMARYL) 2 MG tablet TAKE 1 TABLET BY MOUTH EVERY DAY WITH BREAKFAST (Patient not taking: Reported on 10/01/2019)  . glucose blood (ACCU-CHEK AVIVA PLUS) test strip Use to check blood sugars once daily. Dx: E11.65 (Patient not taking: Reported on 10/01/2019)  . Lancet Devices (ACCU-CHEK SOFTCLIX) lancets Use to check blood sugars once daily. Dx: E11.65 (Patient not taking: Reported on 10/01/2019)  . ondansetron (ZOFRAN) 4 MG tablet TAKE 1 TABLET BY MOUTH EVERY 8 HOURS AS NEEDED FOR NAUSEA AND VOMITING FOR UP TO 7 DAYS  . ONE TOUCH LANCETS MISC Use 3-4 times as week as directed   . pantoprazole (PROTONIX) 40 MG tablet Take 40 mg by mouth daily.   No facility-administered encounter medications on file as of 10/01/2019.     Activities of Daily Living In your present state of health, do you have any difficulty performing the following activities: 10/01/2019  Hearing? N  Vision? N  Difficulty concentrating or making decisions? N  Walking or climbing stairs? N  Dressing or bathing? N  Doing errands, shopping? N  Preparing Food and eating ? N  Using the Toilet? N  In the past six months, have you accidently leaked urine? N  Do you have problems with loss of bowel control? N  Managing your Medications? N  Managing your Finances? N  Housekeeping or managing your Housekeeping? N  Some recent data might be hidden     Patient Care Team: Eulas Post, MD as PCP - [facility], [facility] (Specialist)   Assessment:   This is a routine wellness examination for Adam [name].  Exercise Activities and Dietary recommendations Current Exercise Habits: The patient has a physically strenuous job, but has no regular exercise apart from work., Exercise limited by: None identified  Goals    . 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 0 No Yes No  Number falls in past yr: 0 - 2 or more -  Injury with Fall? - - No -  Risk for fall due to : Medication side effect - - -  Follow up Falls evaluation completed;Education provided;Falls prevention discussed - Falls prevention discussed -   Is the patient's home free of loose throw rugs in walkways, pet beds, electrical cords, etc?   yes      Grab bars in the bathroom? yes      Handrails on the stairs?  yes      Adequate lighting?   yes  Timed Get Up and Go Performed: N/A due to telephone visit  Depression Screen PHQ 2/9 Scores 10/01/2019 04/26/2018 04/25/2017 04/26/2016  PHQ - 2 Score 0 0 0 0    Cognitive Function MMSE - Mini Mental State Exam 04/26/2018  Not completed: (No Data)     6CIT Screen 10/01/2019  What Year? 0 points  What month? 0 points  What time? 0 points  Count back from 20 0 points  Months in reverse 0 points  Repeat phrase 0 points  Total Score 0    Immunization History  Administered Date(s) Administered  . Pneumococcal Conjugate-13 05/09/2014  . Pneumococcal Polysaccharide-23 04/25/2017    Qualifies for Shingles Vaccine? Yes; patient to check with his local pharmacy for out of pocket expense estimation and possibly obtain there.   Screening Tests Health Maintenance  Topic Date Due  . TETANUS/TDAP  11/27/1967  . COLONOSCOPY  11/26/1998  . OPHTHALMOLOGY EXAM  05/10/2015  . FOOT EXAM  05/02/2018  . URINE MICROALBUMIN  05/02/2018  .  HEMOGLOBIN A1C  02/22/2019  . INFLUENZA VACCINE  05/26/2019  . Hepatitis C Screening  Completed  . PNA vac Low Risk Adult  Completed   Cancer Screenings: Lung: Low Dose CT Chest recommended if Age 58-80 years, 30 pack-year currently smoking OR have quit w/in 15years. Patient does qualify. Colorectal: PATIENT DECLINES COLONOSCOPY AND DID NOT COMPLETE COLOGUARD THAT WAS GIVEN TO HIM IN 2019.  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 He did report he was hospitalized in November 2020 [place]. Instructed patient to see Dr. Caryl NeverBurchette soon for a follow up appointment for a diabetic foot exam and labs. He is also past due for a bone density test. Patient reported that he has not been taking Fosamax and Amaryl. Medication education provided today and reinforced importance of taking those medications as prescribed.He also reported not checking his blood sugars at home.    I have personally reviewed and noted the following in the patient's chart:   . Medical and social history . Use of alcohol, tobacco or illicit drugs  . Current medications and supplements . Functional ability and status . Nutritional status . Physical activity . Advanced directives . List of other physicians . Hospitalizations, surgeries, and ER visits in previous 12 months . Vitals . Screenings to include cognitive, depression, and falls . Referrals and appointments  In addition, I have reviewed and discussed with patient certain preventive protocols, quality metrics, and best practice recommendations. A written personalized care plan for preventive services as well as general preventive health recommendations were provided to patient.     Nathaniel ManChristy Loralei Radcliffe, LPN  16/1/096012/04/2019

## 2019-10-01 NOTE — Patient Instructions (Addendum)
[name] , Thank you for taking time to participate in your Medicare Wellness Visit. I appreciate your ongoing commitment to your health goals. Please review the following plan we discussed and let me know if I can assist you in the future.   Screening recommendations/referrals: Colorectal Screening: patient continues to decline. He stated he received the cologuard last year but did not complete that.   Vision and Dental Exams: Recommended annual ophthalmology exams for early detection of glaucoma and other disorders of the eye Recommended annual dental exams for proper oral hygiene.  Patient states he has not had a recent eye exam but understands the importance; especially because he is a diabetic.  Diabetic Exams: Diabetic Eye Exam: DUE NOW Diabetic Foot Exam: DUE NOW  Vaccinations: Influenza vaccine: PATIENT DECLINES  Pneumococcal vaccine: 04/25/2017; up to date Tdap vaccine: Need for Tdap was discussed today.He thinks he may have received this at the medical center in [place], [place]. Records release form sent to patient so we can try to obtain that record. 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  Shingles vaccine: Please check with your local pharmacy to determine your out of pocket expense for the Shingrix vaccine. You may receive this vaccine (series of two) at your local pharmacy.  Advanced directives: Advance directives discussed with you today. I have provided a copy for you to complete at home and have notarized. Once this is complete please bring a copy in to our office so we can scan it into your chart.  Goals: Recommend to drink at least 6-8 8oz glasses of water per day. Recommend to continue efforts to reduce smoking habits until no longer smoking. Smoking Cessation literature is attached below.  Next appointment: Please schedule your Annual Wellness Visit with your Nurse Health Advisor in one year.  Preventive Care 3865 Years and Older,  Male Preventive care refers to lifestyle choices and visits with your health care provider that can promote health and wellness. What does preventive care include?  A yearly physical exam. This is also called an annual well check.  Dental exams once or twice a year.  Routine eye exams. Ask your health care provider how often you should have your eyes checked.  Personal lifestyle choices, including:  Daily care of your teeth and gums.  Regular physical activity.  Eating a healthy diet.  Avoiding tobacco and drug use.  Limiting alcohol use.  Practicing safe sex.  Taking low doses of aspirin every day if recommended by your health care provider..  Taking vitamin and mineral supplements as recommended by your health care provider. What happens during an annual well check? The services and screenings done by your health care provider during your annual well check will depend on your age, overall health, lifestyle risk factors, and family history of disease. Counseling  Your health care provider may ask you questions about your:  Alcohol use.  Tobacco use.  Drug use.  Emotional well-being.  Home and relationship well-being.  Sexual activity.  Eating habits.  History of falls.  Memory and ability to understand (cognition).  Work and work Astronome[employer]vironment. Screening  You may have the following tests or measurements:  Height, weight, and BMI.  Blood pressure.  Lipid and cholesterol levels. These may be checked every 5 years, or more frequently if you are over 70 years old.  Skin check.  Lung cancer screening. You may have this screening every year starting at age 70 if you have a  30-pack-year history of smoking and currently smoke or have quit within the past 15 years.  Fecal occult blood test (FOBT) of the stool. You may have this test every year starting at age 68.  Flexible sigmoidoscopy or colonoscopy. You may have a sigmoidoscopy every 5 years or a colonoscopy  every 10 years starting at age 75.  Prostate cancer screening. Recommendations will vary depending on your family history and other risks.  Hepatitis C blood test.  Hepatitis B blood test.  Sexually transmitted disease (STD) testing.  Diabetes screening. This is done by checking your blood sugar (glucose) after you have not eaten for a while (fasting). You may have this done every 1-3 years.  Abdominal aortic aneurysm (AAA) screening. You may need this if you are a current or former smoker.  Osteoporosis. You may be screened starting at age 62 if you are at high risk. Talk with your health care provider about your test results, treatment options, and if necessary, the need for more tests. Vaccines  Your health care provider may recommend certain vaccines, such as:  Influenza vaccine. This is recommended every year.  Tetanus, diphtheria, and acellular pertussis (Tdap, Td) vaccine. You may need a Td booster every 10 years.  Zoster vaccine. You may need this after age 42.  Pneumococcal 13-valent conjugate (PCV13) vaccine. One dose is recommended after age 63.  Pneumococcal polysaccharide (PPSV23) vaccine. One dose is recommended after age 53. Talk to your health care provider about which screenings and vaccines you need and how often you need them. This information is not intended to replace advice given to you by your health care provider. Make sure you discuss any questions you have with your health care provider. Document Released: 11/07/2015 Document Revised: 06/30/2016 Document Reviewed: 08/12/2015 Elsevier Interactive Patient Education  2017 [employer].  Fall Prevention in the Home Falls can cause injuries. They can happen to people of all ages. There are many things you can do to make your home safe and to help prevent falls. What can I do on the outside of my home?  Regularly fix the edges of walkways and driveways and fix any cracks.  Remove anything that might make  you trip as you walk through a door, such as a raised step or threshold.  Trim any bushes or trees on the path to your home.  Use bright outdoor lighting.  Clear any walking paths of anything that might make someone trip, such as rocks or tools.  Regularly check to see if handrails are loose or broken. Make sure that both sides of any steps have handrails.  Any raised decks and porches should have guardrails on the edges.  Have any leaves, snow, or ice cleared regularly.  Use sand or salt on walking paths during winter.  Clean up any spills in your garage right away. This includes oil or grease spills. What can I do in the bathroom?  Use night lights.  Install grab bars by the toilet and in the tub and shower. Do not use towel bars as grab bars.  Use non-skid mats or decals in the tub or shower.  If you need to sit down in the shower, use a plastic, non-slip stool.  Keep the floor dry. Clean up any water that spills on the floor as soon as it happens.  Remove soap buildup in the tub or shower regularly.  Attach bath mats securely with double-sided non-slip rug tape.  Do not have throw rugs and other things on  the floor that can make you trip. What can I do in the bedroom?  Use night lights.  Make sure that you have a light by your bed that is easy to reach.  Do not use any sheets or blankets that are too big for your bed. They should not hang down onto the floor.  Have a firm chair that has side arms. You can use this for support while you get dressed.  Do not have throw rugs and other things on the floor that can make you trip. What can I do in the kitchen?  Clean up any spills right away.  Avoid walking on wet floors.  Keep items that you use a lot in easy-to-reach places.  If you need to reach something above you, use a strong step stool that has a grab bar.  Keep electrical cords out of the way.  Do not use floor polish or wax that makes floors slippery.  If you must use wax, use non-skid floor wax.  Do not have throw rugs and other things on the floor that can make you trip. What can I do with my stairs?  Do not leave any items on the stairs.  Make sure that there are handrails on both sides of the stairs and use them. Fix handrails that are broken or loose. Make sure that handrails are as long as the stairways.  Check any carpeting to make sure that it is firmly attached to the stairs. Fix any carpet that is loose or worn.  Avoid having throw rugs at the top or bottom of the stairs. If you do have throw rugs, attach them to the floor with carpet tape.  Make sure that you have a light switch at the top of the stairs and the bottom of the stairs. If you do not have them, ask someone to add them for you. What else can I do to help prevent falls?  Wear shoes that:  Do not have high heels.  Have rubber bottoms.  Are comfortable and fit you well.  Are closed at the toe. Do not wear sandals.  If you use a stepladder:  Make sure that it is fully opened. Do not climb a closed stepladder.  Make sure that both sides of the stepladder are locked into place.  Ask someone to hold it for you, if possible.  Clearly mark and make sure that you can see:  Any grab bars or handrails.  First and last steps.  Where the edge of each step is.  Use tools that help you move around (mobility aids) if they are needed. These include:  Canes.  Walkers.  Scooters.  Crutches.  Turn on the lights when you go into a dark area. Replace any light bulbs as soon as they burn out.  Set up your furniture so you have a clear path. Avoid moving your furniture around.  If any of your floors are uneven, fix them.  If there are any pets around you, be aware of where they are.  Review your medicines with your doctor. Some medicines can make you feel dizzy. This can increase your chance of falling. Ask your doctor what other things that you can do to  help prevent falls. This information is not intended to replace advice given to you by your health care provider. Make sure you discuss any questions you have with your health care provider. Document Released: 08/07/2009 Document Revised: 03/18/2016 Document Reviewed: 11/15/2014 Elsevier Interactive Patient Education  2017 [place].

## 2019-10-03 ENCOUNTER — Other Ambulatory Visit: Payer: Self-pay | Admitting: Family Medicine

## 2019-10-20 ENCOUNTER — Other Ambulatory Visit: Payer: Self-pay | Admitting: Family Medicine

## 2019-10-24 ENCOUNTER — Other Ambulatory Visit: Payer: Self-pay | Admitting: Family Medicine

## 2019-10-24 NOTE — Telephone Encounter (Signed)
Medication Refill - Medication: metFORMIN (GLUCOPHAGE) 500 MG tablet [[id number]    Preferred Pharmacy (with phone number or street name):  [facility] [address] [place]  [place] [address]  Phone: [phone] Fax: [phone]     Agent: Please be advised that RX refills may take up to 3 business days. We ask that you follow-up with your pharmacy.

## 2019-11-08 ENCOUNTER — Other Ambulatory Visit: Payer: Self-pay | Admitting: Family Medicine

## 2019-11-16 ENCOUNTER — Encounter: Payer: Self-pay | Admitting: Family Medicine

## 2019-11-16 ENCOUNTER — Ambulatory Visit (INDEPENDENT_AMBULATORY_CARE_PROVIDER_SITE_OTHER): Payer: Medicare Other | Admitting: Family Medicine

## 2019-11-16 ENCOUNTER — Other Ambulatory Visit: Payer: Self-pay

## 2019-11-16 VITALS — BP 130/80 | HR 78 | Temp 98.2°F | Wt 160.7 lb

## 2019-11-16 DIAGNOSIS — R3915 Urgency of urination: Secondary | ICD-10-CM

## 2019-11-16 DIAGNOSIS — E1151 Type 2 diabetes mellitus with diabetic peripheral angiopathy without gangrene: Secondary | ICD-10-CM | POA: Diagnosis not present

## 2019-11-16 DIAGNOSIS — E785 Hyperlipidemia, unspecified: Secondary | ICD-10-CM | POA: Diagnosis not present

## 2019-11-16 DIAGNOSIS — M810 Age-related osteoporosis without current pathological fracture: Secondary | ICD-10-CM

## 2019-11-16 LAB — BASIC METABOLIC PANEL
BUN: 9 mg/dL (ref 6–23)
CO2: 28 mEq/L (ref 19–32)
Calcium: 9.9 mg/dL (ref 8.4–10.5)
Chloride: 102 mEq/L (ref 96–112)
Creatinine, Ser: 1.06 mg/dL (ref 0.40–1.50)
GFR: 68.88 mL/min (ref >60.00)
Glucose, Bld: 252 mg/dL — ABNORMAL HIGH (ref 70–99)
Potassium: 4.9 mEq/L (ref 3.5–5.1)
Sodium: 138 mEq/L (ref 135–145)

## 2019-11-16 LAB — HEPATIC FUNCTION PANEL
ALT: 9 U/L (ref 0–53)
AST: 12 U/L (ref 0–37)
Albumin: 4.3 g/dL (ref 3.5–5.2)
Alkaline Phosphatase: 67 U/L (ref 39–117)
Bilirubin, Direct: 0.1 mg/dL (ref 0.0–0.3)
Total Bilirubin: 0.6 mg/dL (ref 0.2–1.2)
Total Protein: 6.8 g/dL (ref 6.0–8.3)

## 2019-11-16 LAB — LIPID PANEL
Cholesterol: 229 mg/dL — ABNORMAL HIGH (ref 0–200)
HDL: 49.6 mg/dL (ref >39.00)
LDL Cholesterol: 154 mg/dL — ABNORMAL HIGH (ref 0–99)
NonHDL: 179.8
Total CHOL/HDL Ratio: 5
Triglycerides: 130 mg/dL (ref 0.0–149.0)
VLDL: 26 mg/dL (ref 0.0–40.0)

## 2019-11-16 LAB — HEMOGLOBIN A1C: Hgb A1c MFr Bld: 9.8 % — ABNORMAL HIGH (ref 4.6–6.5)

## 2019-11-16 MED ORDER — SIMVASTATIN 80 MG PO TABS: ORAL_TABLET | ORAL | 3 refills | Status: DC

## 2019-11-16 MED ORDER — METFORMIN HCL 500 MG PO TABS: 1000.0000 mg | ORAL_TABLET | Freq: Two times a day (BID) | ORAL | 3 refills | Status: DC

## 2019-11-16 NOTE — Progress Notes (Signed)
Subjective:     Patient ID: [name], male   DOB: 1951-03-06, 69 y.o.   MRN: [id number]  HPI Mr. Hucker is seen for medical follow-up.  Not seen here in over a year.  He has history of type 2 diabetes, hyperlipidemia, osteoporosis, ongoing nicotine use.  He states he had presumed pneumonia back in the fall.  He was admitted at a hospital down near [place].  He feels fully recovered at this time.  No lingering cough.  No fever.  Appetite and weight are stable.  He is on Metformin 500 mg 2 tablets twice daily.  He had been on glimepiride but is not taking that currently.  Not monitoring sugars regularly.  No polyuria or polydipsia.  He is on simvastatin 40 mg daily.  Out of medicines about 4 days ago.  No history of myalgias.  He does have some urinary urgency.  Occasional slow stream but more urgency than anything.  No burning with urination.  No excessive caffeine use.  He is not interested in medication options yet.  Past Medical History:  Diagnosis Date  . DIABETES MELLITUS, UNCONTROLLED 09/03/2010  . HYPERLIPIDEMIA 08/27/2010  . 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 PERIPHERAL VASCULAR DISEASE 08/27/2010  . WEIGHT LOSS 08/27/2010   Past Surgical History:  Procedure Laterality Date  . BACK SURGERY  2003  . CHOLECYSTECTOMY  1990  . KNEE SURGERY  1995   left    reports that he has been smoking cigarettes. He has a 50.00 pack-year smoking history. He has never used smokeless tobacco. He reports previous alcohol use. He reports that he does not use drugs. family history includes Cancer in his paternal grandfather; Heart disease in his father. Allergies  Allergen Reactions  . Naproxen Sodium Swelling     Review of Systems  Constitutional: Negative for fatigue.  Eyes: Negative for visual disturbance.  Respiratory: Negative for cough, chest tightness and shortness of breath.   Cardiovascular: Negative for chest pain, palpitations and leg swelling.  Genitourinary: Positive for  urgency.  Neurological: Negative for dizziness, syncope, weakness, light-headedness and headaches.       Objective:   Physical Exam Constitutional:      Appearance: He is well-developed.  HENT:     Right Ear: External ear normal.     Left Ear: External ear normal.  Eyes:     Pupils: Pupils are equal, round, and reactive to light.  Neck:     Thyroid: No thyromegaly.  Cardiovascular:     Rate and Rhythm: Normal rate and regular rhythm.  Pulmonary:     Effort: Pulmonary effort is normal. No respiratory distress.     Breath sounds: Normal breath sounds. No wheezing or rales.  Musculoskeletal:     Cervical back: Neck supple.  Neurological:     Mental Status: He is alert and oriented to person, place, and time.        Assessment:     #1 type 2 diabetes.  Unknown current level of control.  History of poor compliance of medication  #2 hyperlipidemia with goal LDL less than 70  #3 urinary urgency  #4 ongoing nicotine use    Plan:     -Flu vaccine advised but declined -Pneumonia vaccines already given -Recheck labs with A1c, lipid, hepatic, basic metabolic panel -Refilled Metformin and simvastatin for 1 year -Advised to follow-up at least within 6 months and possibly earlier depending on lab results  Kristian Covey MD [facility]

## 2019-11-19 ENCOUNTER — Other Ambulatory Visit: Payer: Self-pay

## 2019-11-19 MED ORDER — GLIMEPIRIDE 2 MG PO TABS: ORAL_TABLET | ORAL | 3 refills | Status: DC

## 2020-01-26 ENCOUNTER — Other Ambulatory Visit: Payer: Self-pay | Admitting: Family Medicine

## 2020-02-15 ENCOUNTER — Ambulatory Visit: Payer: Medicare Other | Admitting: Family Medicine

## 2020-02-15 ENCOUNTER — Other Ambulatory Visit: Payer: Self-pay

## 2020-02-21 ENCOUNTER — Other Ambulatory Visit: Payer: Self-pay

## 2020-02-22 ENCOUNTER — Encounter: Payer: Self-pay | Admitting: Family Medicine

## 2020-02-22 ENCOUNTER — Ambulatory Visit (INDEPENDENT_AMBULATORY_CARE_PROVIDER_SITE_OTHER): Payer: Medicare Other | Admitting: Family Medicine

## 2020-02-22 ENCOUNTER — Ambulatory Visit: Payer: Medicare Other | Admitting: Family Medicine

## 2020-02-22 VITALS — BP 134/76 | HR 75 | Temp 97.9°F | Wt 164.7 lb

## 2020-02-22 DIAGNOSIS — E1151 Type 2 diabetes mellitus with diabetic peripheral angiopathy without gangrene: Secondary | ICD-10-CM | POA: Diagnosis not present

## 2020-02-22 DIAGNOSIS — E785 Hyperlipidemia, unspecified: Secondary | ICD-10-CM | POA: Diagnosis not present

## 2020-02-22 DIAGNOSIS — I739 Peripheral vascular disease, unspecified: Secondary | ICD-10-CM | POA: Diagnosis not present

## 2020-02-22 DIAGNOSIS — E1169 Type 2 diabetes mellitus with other specified complication: Secondary | ICD-10-CM | POA: Diagnosis not present

## 2020-02-22 LAB — LIPID PANEL
Cholesterol: 183 mg/dL (ref 0–200)
HDL: 45.4 mg/dL (ref >39.00)
LDL Cholesterol: 107 mg/dL — ABNORMAL HIGH (ref 0–99)
NonHDL: 137.39
Total CHOL/HDL Ratio: 4
Triglycerides: 151 mg/dL — ABNORMAL HIGH (ref 0.0–149.0)
VLDL: 30.2 mg/dL (ref 0.0–40.0)

## 2020-02-22 LAB — HEPATIC FUNCTION PANEL
ALT: 7 U/L (ref 0–53)
AST: 11 U/L (ref 0–37)
Albumin: 4.3 g/dL (ref 3.5–5.2)
Alkaline Phosphatase: 57 U/L (ref 39–117)
Bilirubin, Direct: 0.1 mg/dL (ref 0.0–0.3)
Total Bilirubin: 0.6 mg/dL (ref 0.2–1.2)
Total Protein: 6.7 g/dL (ref 6.0–8.3)

## 2020-02-22 LAB — POCT GLYCOSYLATED HEMOGLOBIN (HGB A1C): Hemoglobin A1C: 7.4 % — AB (ref 4.0–5.6)

## 2020-02-22 MED ORDER — JARDIANCE 10 MG PO TABS: 10.0000 mg | ORAL_TABLET | Freq: Every day | ORAL | 3 refills | Status: DC

## 2020-02-22 MED ORDER — SIMVASTATIN 40 MG PO TABS: 40.0000 mg | ORAL_TABLET | Freq: Every day | ORAL | 3 refills | Status: DC

## 2020-02-22 MED ORDER — GLIMEPIRIDE 2 MG PO TABS: ORAL_TABLET | ORAL | 3 refills | Status: DC

## 2020-02-22 MED ORDER — JARDIANCE 10 MG PO TABS: 10.0000 mg | ORAL_TABLET | Freq: Every day | ORAL | 0 refills | Status: DC

## 2020-02-22 MED ORDER — GLIMEPIRIDE 2 MG PO TABS: ORAL_TABLET | ORAL | 0 refills | Status: DC

## 2020-02-22 MED ORDER — SIMVASTATIN 40 MG PO TABS: 40.0000 mg | ORAL_TABLET | Freq: Every day | ORAL | 0 refills | Status: DC

## 2020-02-22 NOTE — Progress Notes (Signed)
Subjective:     Patient ID: Adam [name], male   DOB: [birth year], 69 y.o.   MRN: [id number]  HPI   Adam [name] is seen for medical follow-up.  He has history of type 2 diabetes, peripheral vascular disease, hyperlipidemia, ongoing nicotine use  Regarding type 2 diabetes he had recent A1c 9.8%.  We added low-dose glimepiride 2 mg daily to his Metformin and A1c down to 7.4% today.  No recent hypoglycemic symptoms.  He states he is taking his medications regularly.  He had elevated lipids at last check but was not taking his simvastatin regularly that point.  He had run out.  He is needing refills today of glimepiride and simvastatin.  No significant myalgias.  No chest pains.  Also has history of osteoporosis and not taking Fosamax regularly.  Past Medical History:  Diagnosis Date  . DIABETES MELLITUS, UNCONTROLLED 09/03/2010  . HYPERLIPIDEMIA 08/27/2010  . 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 PERIPHERAL VASCULAR DISEASE 08/27/2010  . WEIGHT LOSS 08/27/2010   Past Surgical History:  Procedure Laterality Date  . BACK SURGERY  2003  . CHOLECYSTECTOMY  1990  . [place]   left    reports that he has been smoking cigarettes. He has a 50.00 pack-year smoking history. He has never used smokeless tobacco. He reports previous alcohol use. He reports that he does not use drugs. family history includes Cancer in his paternal grandfather; Heart disease in his father. Allergies  Allergen Reactions  . Naproxen Sodium Swelling     Review of Systems  Constitutional: Negative for fatigue and unexpected weight change.  Eyes: Negative for visual disturbance.  Respiratory: Negative for cough, chest tightness and shortness of breath.   Cardiovascular: Negative for chest pain, palpitations and leg swelling.  Endocrine: Negative for polydipsia and polyuria.  Genitourinary: Negative for dysuria.  Neurological: Negative for dizziness, syncope, weakness, light-headedness and headaches.        Objective:   Physical Exam Constitutional:      Appearance: He is well-developed.  HENT:     Right Ear: External ear normal.     Left Ear: External ear normal.  Eyes:     Pupils: Pupils are equal, round, and reactive to light.  Neck:     Thyroid: No thyromegaly.  Cardiovascular:     Rate and Rhythm: Normal rate and regular rhythm.  Pulmonary:     Effort: Pulmonary effort is normal. No respiratory distress.     Breath sounds: Normal breath sounds. No wheezing or rales.  Musculoskeletal:     Cervical back: Neck supple.     Right lower leg: No edema.     Left lower leg: No edema.  Neurological:     Mental Status: He is alert and oriented to person, place, and time.        Assessment:     #1 type 2 diabetes improved from 9.8% 7.4% but still suboptimally controlled.  Like to see A1c less than 7  #2 history of hyperlipidemia.  Patient on simvastatin.  Goal LDL less than 70  #3 ongoing nicotine use.  Low motivation to quit    Plan:     -Add Jardiance 10 mg once daily if we can get this covered by insurance  -Recheck lipid and hepatic panel today.  Hopefully lipids will be improved with recent addition back of simvastatin.  If not to goal consider more potent statin such as Lipitor or Crestor  -Refilled his glimepiride and simvastatin for 1 year  -  Recommend routine follow-up in 4 to 6 months and sooner as needed  Kristian Covey MD [facility] at [facility]

## 2020-02-25 MED ORDER — ATORVASTATIN CALCIUM 40 MG PO TABS
40.0000 mg | ORAL_TABLET | Freq: Every day | ORAL | 3 refills | Status: DC
Start: 2020-02-25 — End: 2021-04-09

## 2020-02-25 NOTE — Addendum Note (Signed)
Addended by: Waymon Amato R on: 02/25/2020 05:21 PM   Modules accepted: Orders

## 2020-02-25 NOTE — Addendum Note (Signed)
Addended by: Waymon Amato R on: 02/25/2020 05:19 PM   Modules accepted: Orders

## 2020-03-24 ENCOUNTER — Other Ambulatory Visit: Payer: Self-pay | Admitting: Family Medicine

## 2020-03-30 ENCOUNTER — Other Ambulatory Visit: Payer: Self-pay | Admitting: Family Medicine

## 2020-04-21 LAB — HM DIABETES EYE EXAM

## 2020-04-21 LAB — HEMOGLOBIN A1C: Hemoglobin A1C: 7.6

## 2020-05-02 ENCOUNTER — Encounter: Payer: Self-pay | Admitting: Family Medicine

## 2020-05-06 ENCOUNTER — Other Ambulatory Visit: Payer: Self-pay | Admitting: Family Medicine

## 2020-05-09 ENCOUNTER — Encounter: Payer: Self-pay | Admitting: Family Medicine

## 2020-05-09 ENCOUNTER — Other Ambulatory Visit: Payer: Self-pay

## 2020-05-09 ENCOUNTER — Ambulatory Visit (INDEPENDENT_AMBULATORY_CARE_PROVIDER_SITE_OTHER): Payer: Medicare Other | Admitting: Family Medicine

## 2020-05-09 VITALS — BP 136/78 | HR 74 | Temp 98.4°F | Wt 163.1 lb

## 2020-05-09 DIAGNOSIS — E785 Hyperlipidemia, unspecified: Secondary | ICD-10-CM

## 2020-05-09 DIAGNOSIS — M810 Age-related osteoporosis without current pathological fracture: Secondary | ICD-10-CM

## 2020-05-09 DIAGNOSIS — E113299 Type 2 diabetes mellitus with mild nonproliferative diabetic retinopathy without macular edema, unspecified eye: Secondary | ICD-10-CM | POA: Diagnosis not present

## 2020-05-09 NOTE — Progress Notes (Signed)
Established Patient Office Visit  Subjective:  Patient ID: Adam [name], male    DOB: September 12, 1951  Age: 69 y.o. MRN: [id number]  CC:  Chief Complaint  Patient presents with  . Eye Problem    pt had someone come out and do eye exam and they sent him a letter stating he needs an office visit with pcp    HPI [name] presents for possible referral to ophthalmology. His chronic problems include type 2 diabetes, peripheral vascular disease, osteoporosis, hyperlipidemia, ongoing nicotine use.  He reports that someone affiliated with his insurance company with [facility] came out to his house to do a diabetic retinopathy screen.  He was diagnosed with intraretinal hemorrhage and background diabetic retinopathy changes.  He has not seen optometrist or ophthalmologist for a good exam in some time.  We reviewed documents from recent [facility] visit.  Last A1c end of April was 7.4%.  He has not had recent urine microalbumin screen.  He had lipids done in April as well  His current medications include aspirin, Lipitor, glimepiride, Jardiance, Metformin.  He had been placed on Fosamax for osteoporosis but is no longer taking that.  He has made some dietary changes to try to tighten up his blood sugar control.  Does not monitor blood sugars regularly.  No recent hypoglycemia.  No significant polyuria or polydipsia.  Past Medical History:  Diagnosis Date  . DIABETES MELLITUS, UNCONTROLLED 09/03/2010  . HYPERLIPIDEMIA 08/27/2010  . 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 PERIPHERAL VASCULAR DISEASE 08/27/2010  . WEIGHT LOSS 08/27/2010    Past Surgical History:  Procedure Laterality Date  . BACK SURGERY  2003  . CHOLECYSTECTOMY  1990  . KNEE SURGERY  1995   left    Family History  Problem Relation Age of Onset  . Heart disease Father   . Cancer Paternal Grandfather        lung    Social History   Socioeconomic History  . Marital status: Single    Spouse name: Not on  file  . Number of children: 3  . Years of education: 4 years college  . Highest education level: Bachelor's degree (e.g., BA, AB, BS)  Occupational History  . Not on file  Tobacco Use  . Smoking status: Current Every Day Smoker    Packs/day: 1.00    Years: 50.00    Pack years: 50.00    Types: Cigarettes  . Smokeless tobacco: Never Used  Vaping Use  . Vaping Use: Never used  Substance and Sexual Activity  . Alcohol use: Not Currently    Alcohol/week: 0.0 standard drinks    Comment: occasional  . Drug use: No  . Sexual activity: Not on file  Other Topics Concern  . Not on file  Social History Narrative  . Not on file   Social Determinants of Health   Financial Resource Strain: Medium Risk  . Difficulty of Paying Living Expenses: Somewhat hard  Food Insecurity: No Food Insecurity  . Worried About Programme researcher, broadcasting/film/video in the Last Year: Never true  . Ran Out of Food in the Last Year: Never true  Transportation Needs: No Transportation Needs  . Lack of Transportation (Medical): No  . Lack of Transportation (Non-Medical): No  Physical Activity: Sufficiently Active  . Days of Exercise per Week: 7 days  . Minutes of Exercise per Session: 120 min  Stress: No Stress Concern Present  . Feeling of Stress : Not at all  Social Connections: Unknown  . Frequency of Communication with Friends and Family: More than three times a week  . Frequency of Social Gatherings with Friends and Family: Not on file  . Attends Religious Services: Not on file  . Active Member of Clubs or Organizations: Not on file  . Attends Banker Meetings: Not on file  . Marital Status: Not on file  Intimate Partner Violence:   . Fear of Current or Ex-Partner:   . Emotionally Abused:   Marland Kitchen Physically Abused:   . Sexually Abused:     Outpatient Medications Prior to Visit  Medication Sig Dispense Refill  . aspirin 325 MG tablet Take 650 mg by mouth daily.     Marland Kitchen atorvastatin (LIPITOR) 40 MG tablet  Take 1 tablet (40 mg total) by mouth daily. 90 tablet 3  . glimepiride (AMARYL) 2 MG tablet TAKE 1 TABLET BY MOUTH EVERY DAY WITH BREAKFAST 30 tablet 0  . JARDIANCE 10 MG TABS tablet TAKE 1 TABLET BY MOUTH ONCE DAILY BEFORE BREAKFAST 90 tablet 0  . metFORMIN (GLUCOPHAGE) 500 MG tablet Take 2 tablets by mouth twice daily 360 tablet 0  . ondansetron (ZOFRAN) 4 MG tablet TAKE 1 TABLET BY MOUTH EVERY 8 HOURS AS NEEDED FOR NAUSEA AND VOMITING FOR UP TO 7 DAYS    . ONE TOUCH LANCETS MISC Use 3-4 times as week as directed     . sildenafil (REVATIO) 20 MG tablet Take 2 to 5 tablets one hour prior to sexual activity. 50 tablet 3  . alendronate (FOSAMAX) 70 MG tablet Take 1 tablet (70 mg total) by mouth every 7 (seven) days. Take with a full glass of water on an empty stomach. (Patient not taking: Reported on 10/01/2019) 12 tablet 3  . glucose blood (ACCU-CHEK AVIVA PLUS) test strip Use to check blood sugars once daily. Dx: E11.65 (Patient not taking: Reported on 10/01/2019) 100 each 2  . Lancet Devices (ACCU-CHEK SOFTCLIX) lancets Use to check blood sugars once daily. Dx: E11.65 (Patient not taking: Reported on 10/01/2019) 100 each 2   No facility-administered medications prior to visit.    Allergies  Allergen Reactions  . Naproxen Sodium Swelling    ROS Review of Systems  Constitutional: Negative for fatigue and unexpected weight change.  Eyes: Negative for visual disturbance.  Respiratory: Negative for cough, chest tightness and shortness of breath.   Cardiovascular: Negative for chest pain, palpitations and leg swelling.  Endocrine: Negative for polydipsia and polyuria.  Genitourinary: Negative for dysuria.  Neurological: Negative for dizziness, syncope, weakness, light-headedness and headaches.      Objective:    Physical Exam Constitutional:      Appearance: He is well-developed. He is not toxic-appearing or diaphoretic.  HENT:     Right Ear: External ear normal.     Left Ear: External  ear normal.  Eyes:     Pupils: Pupils are equal, round, and reactive to light.  Neck:     Thyroid: No thyromegaly.  Cardiovascular:     Rate and Rhythm: Normal rate and regular rhythm.  Pulmonary:     Effort: Pulmonary effort is normal. No respiratory distress.     Breath sounds: Normal breath sounds. No wheezing or rales.  Musculoskeletal:     Cervical back: Neck supple.     Right lower leg: No edema.     Left lower leg: No edema.  Skin:    Comments: Feet reveal no skin lesions. Good distal foot pulses. Good capillary refill. No  calluses. Normal sensation with monofilament testing   Neurological:     Mental Status: He is alert and oriented to person, place, and time.     BP 136/78 (BP Location: Left Arm, Patient Position: Sitting, Cuff Size: Normal)   Pulse 74   Temp 98.4 F (36.9 C) (Temporal)   Wt 163 lb 1.6 oz (74 kg)   SpO2 96%   BMI 22.12 kg/m  Wt Readings from Last 3 Encounters:  05/09/20 163 lb 1.6 oz (74 kg)  02/22/20 164 lb 11.2 oz (74.7 kg)  11/16/19 160 lb 11.2 oz (72.9 kg)     Health Maintenance Due  Topic Date Due  . COVID-19 Vaccine (1) Never done  . TETANUS/TDAP  Never done  . COLONOSCOPY  Never done    There are no preventive care reminders to display for this patient.  Lab Results  Component Value Date   TSH 0.93 08/23/2018   Lab Results  Component Value Date   WBC 12.0 (H) 08/27/2010   HGB 17.9 (H) 08/27/2010   HCT 51.2 08/27/2010   MCV 91.3 08/27/2010   PLT 209.0 08/27/2010   Lab Results  Component Value Date   NA 138 11/16/2019   K 4.9 11/16/2019   CO2 28 11/16/2019   GLUCOSE 252 (H) 11/16/2019   BUN 9 11/16/2019   CREATININE 1.06 11/16/2019   BILITOT 0.6 02/22/2020   ALKPHOS 57 02/22/2020   AST 11 02/22/2020   ALT 7 02/22/2020   PROT 6.7 02/22/2020   ALBUMIN 4.3 02/22/2020   CALCIUM 9.9 11/16/2019   GFR 68.88 11/16/2019   Lab Results  Component Value Date   CHOL 183 02/22/2020   Lab Results  Component Value Date    HDL 45.40 02/22/2020   Lab Results  Component Value Date   LDLCALC 107 (H) 02/22/2020   Lab Results  Component Value Date   TRIG 151.0 (H) 02/22/2020   Lab Results  Component Value Date   CHOLHDL 4 02/22/2020   Lab Results  Component Value Date   HGBA1C 7.4 (A) 02/22/2020      Assessment & Plan:   #1 type 2 diabetes with recent concern for diabetic retinopathy on home screening.  -Needs good and thorough ophthalmology exam and we will set this up -Continue positive dietary changes.  We recommend 85-month follow-up and recheck A1c then.  Goal A1c less than 7%. -Needs urine microalbumin screen.  We placed order today but he was unable to give specimen  #2 hyperlipidemia.  Goal LDL less than 70.  Continue regular use of Lipitor.  #3 osteoporosis.  We have advised Fosamax therapy at past but he has declined.  No orders of the defined types were placed in this encounter.   Follow-up: Return in about 3 months (around 08/09/2020).    Evelena Peat, MD

## 2020-05-09 NOTE — Patient Instructions (Signed)
Diabetic Retinopathy Diabetic retinopathy is a disease of the retina. The retina is a light-sensitive membrane at the back of the eye. Retinopathy is a complication of diabetes (diabetes mellitus) and a common cause of bad eyesight (visual impairment). It can eventually cause blindness. Early detection and treatment of diabetic retinopathy is important in keeping your eyes healthy and preventing further damage to them. What are the causes? Diabetic retinopathy is caused by blood sugar (glucose) levels that are too high for an extended period of time. High blood glucose over an extended period of time can:  Damage small blood vessels in the retina, allowing blood to leak through the vessel walls.  Cause new, abnormal blood vessels to grow on the retina. This can scar the retina in the advanced stage of diabetic retinopathy. What increases the risk? You are more likely to develop this condition if:  You have had diabetes for a long time.  You have poorly controlled blood glucose.  You have high blood pressure. What are the signs or symptoms? In the early stages of diabetic retinopathy, there are often no symptoms. As the condition gets worse, symptoms may include:  Blurred vision. This is usually caused by swelling due to abnormal blood glucose levels. The blurriness may go away when blood glucose levels return to normal.  Moving specks or dark spots (floaters) in your vision. These can be caused by a small amount of bleeding (hemorrhage) from retinal blood vessels.  Missing parts of your field of vision, such as vision at the sides of the eyes. This can be caused by larger retinal hemorrhages.  Difficulty reading.  Double vision.  Pain in one or both eyes.  Feeling pressure in one or both eyes.  Trouble seeing straight lines. Straight lines may not look straight.  Redness of the eyes that does not go away. How is this diagnosed? This condition may be diagnosed with an eye exam in  which your eye care specialist puts drops in your eyes that enlarge (dilate) your pupils. This lets your health care provider examine your retina and check for changes in your retinal blood vessels. How is this treated? This condition may be treated by:  Keeping your blood glucose and blood pressure within a target range.  Using a type of laser beam to seal your retinal blood vessels. This stops them from bleeding and decreases pressure in your eye.  Getting shots of medicine in the eye to reduce swelling of the center of the retina (macula). You may be given: ? Anti-VEGF medicine. This medicine can help slow vision loss, and may even improve vision. ? Steroid medicine. Follow these instructions at home:   Follow your diabetes management plan as directed by your health care provider. This may include exercising regularly and eating a healthy diet.  Keep your blood glucose level and your blood pressure in your target range, as directed by your health care provider.  Check your blood glucose as often as directed.  Take over the counter and prescription medicines only as told by your health care provider. This includes insulin and oral diabetes medicine.  Get your eyes checked at least once every year. An eye specialist can usually see diabetic retinopathy developing long before it starts to cause problems. In many cases, it can be treated to prevent complications from occurring.  Do not use any products that contain nicotine or tobacco, such as cigarettes and e-cigarettes. If you need help quitting, ask your health care provider.  Keep all follow-up   visits as told by your health care provider. This is important. Contact a health care provider if:  You notice gradual blurring or other changes in your vision over time.  You notice that your glasses or contact lenses do not make things look as sharp as they once did.  You have trouble reading or seeing details at a distance with either  eye.  You notice a change in your vision or notice that parts of your field of vision appear missing or hazy.  You suddenly see moving specks or dark spots in the field of vision of either eye. Get help right away if:  You have sudden pain or pressure in one or both eyes.  You suddenly lose vision or a curtain or veil seems to come across your eyes.  You have a sudden burst of floaters in your vision. Summary  Diabetic retinopathy is a disease of the retina. The retina is a light-sensitive membrane at the back of the eye. Retinopathy is a complication of diabetes.  Get your eyes checked at least once every year. An eye specialist can usually see diabetic retinopathy developing long before it starts to cause problems. In many cases, it can be treated to prevent complications from occurring.  Keep your blood glucose and your blood pressure in target range. Follow your diabetes management plan as directed by your health care provider.  Protect your eyes. Wear sunglasses and eye protection when needed. This information is not intended to replace advice given to you by your health care provider. Make sure you discuss any questions you have with your health care provider. Document Revised: 11/23/2017 Document Reviewed: 11/15/2016 Elsevier Patient Education  2020 [employer]  

## 2020-05-13 ENCOUNTER — Encounter: Payer: Self-pay | Admitting: Family Medicine

## 2020-07-11 DIAGNOSIS — H5203 Hypermetropia, bilateral: Secondary | ICD-10-CM | POA: Diagnosis not present

## 2020-07-11 DIAGNOSIS — E113393 Type 2 diabetes mellitus with moderate nonproliferative diabetic retinopathy without macular edema, bilateral: Secondary | ICD-10-CM | POA: Diagnosis not present

## 2020-07-11 LAB — HM DIABETES EYE EXAM

## 2020-07-15 ENCOUNTER — Encounter: Payer: Self-pay | Admitting: Family Medicine

## 2020-08-30 ENCOUNTER — Other Ambulatory Visit: Payer: Self-pay | Admitting: Family Medicine

## 2020-09-29 ENCOUNTER — Telehealth: Payer: Self-pay | Admitting: Family Medicine

## 2020-09-29 NOTE — Telephone Encounter (Signed)
Tried call pt to schedule Medicare Annual Wellness Visit (AWV) either virtually or in office Voicemail full.   Last AWV 09/30/2019  please schedule at anytime with [facility] Nurse Health Advisor 1 or 2   This should be a 45 minute visit.

## 2020-10-02 NOTE — Progress Notes (Signed)
Subjective:   Adam [name] is a 69 y.o. male who presents for Medicare Annual/Subsequent preventive examination.  Review of Systems    N/A  Cardiac Risk Factors include: advanced age (>81men, >47 women);male gender;dyslipidemia;diabetes mellitus     Objective:    Today's Vitals   10/03/20 1113  BP: 108/72  Pulse: 87  Temp: 98.7 F (37.1 C)  TempSrc: Oral  SpO2: 96%  Weight: 168 lb (76.2 kg)  Height: 6' (1.829 m)   Body mass index is 22.78 kg/m.  Advanced Directives 10/03/2020 10/01/2019 04/26/2018 04/25/2017 01/20/2015 11/18/2014  Does Patient Have a Medical Advance Directive? No No No No No No  Would patient like information on creating a medical advance directive? No - Patient declined Yes (MAU/Ambulatory/Procedural Areas - Information given) - No - Patient declined No - patient declined information No - patient declined information    Current Medications (verified) Outpatient Encounter Medications as of 10/03/2020  Medication Sig  . aspirin 325 MG tablet Take 650 mg by mouth daily.  Marland Kitchen atorvastatin (LIPITOR) 40 MG tablet Take 1 tablet (40 mg total) by mouth daily.  Marland Kitchen glimepiride (AMARYL) 2 MG tablet TAKE 1 TABLET BY MOUTH EVERY DAY WITH BREAKFAST  . glucose blood (ACCU-CHEK AVIVA PLUS) test strip Use to check blood sugars once daily. Dx: E11.65  . JARDIANCE 10 MG TABS tablet TAKE 1 TABLET BY MOUTH ONCE DAILY BEFORE BREAKFAST  . Lancet Devices (ACCU-CHEK SOFTCLIX) lancets Use to check blood sugars once daily. Dx: E11.65  . metFORMIN (GLUCOPHAGE) 500 MG tablet Take 2 tablets by mouth twice daily  . ONE TOUCH LANCETS MISC Use 3-4 times as week as directed  . alendronate (FOSAMAX) 70 MG tablet Take 1 tablet (70 mg total) by mouth every 7 (seven) days. Take with a full glass of water on an empty stomach. (Patient not taking: No sig reported)  . ondansetron (ZOFRAN) 4 MG tablet TAKE 1 TABLET BY MOUTH EVERY 8 HOURS AS NEEDED FOR NAUSEA AND VOMITING FOR UP TO 7 DAYS (Patient not  taking: Reported on 10/03/2020)  . sildenafil (REVATIO) 20 MG tablet Take 2 to 5 tablets one hour prior to sexual activity. (Patient not taking: Reported on 10/03/2020)   No facility-administered encounter medications on file as of 10/03/2020.    Allergies (verified) Naproxen sodium   History: Past Medical History:  Diagnosis Date  . DIABETES MELLITUS, UNCONTROLLED 09/03/2010  . HYPERLIPIDEMIA 08/27/2010  . 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 PERIPHERAL VASCULAR DISEASE 08/27/2010  . WEIGHT LOSS 08/27/2010   Past Surgical History:  Procedure Laterality Date  . BACK SURGERY  2003  . CHOLECYSTECTOMY  1990  . KNEE SURGERY  1995   left   Family History  Problem Relation Age of Onset  . Heart disease Father   . Cancer Paternal Grandfather        lung   Social History   Socioeconomic History  . Marital status: Single    Spouse name: Not on file  . Number of children: 3  . Years of education: 4 years college  . Highest education level: Bachelor's degree (e.g., BA, AB, BS)  Occupational History  . Not on file  Tobacco Use  . Smoking status: Current Every Day Smoker    Packs/day: 1.00    Years: 50.00    Pack years: 50.00    Types: Cigarettes  . Smokeless tobacco: Never Used  Vaping Use  . Vaping Use: Never used  Substance and Sexual Activity  . Alcohol  use: Not Currently    Alcohol/week: 0.0 standard drinks    Comment: occasional  . Drug use: No  . Sexual activity: Not on file  Other Topics Concern  . Not on file  Social History Narrative  . Not on file   Social Determinants of Health   Financial Resource Strain: Low Risk   . Difficulty of Paying Living Expenses: Not hard at all  Food Insecurity: No Food Insecurity  . Worried About Programme researcher, broadcasting/film/video in the Last Year: Never true  . Ran Out of Food in the Last Year: Never true  Transportation Needs: No Transportation Needs  . Lack of Transportation (Medical): No  . Lack of Transportation (Non-Medical):  No  Physical Activity: Inactive  . Days of Exercise per Week: 0 days  . Minutes of Exercise per Session: 0 min  Stress: No Stress Concern Present  . Feeling of Stress : Not at all  Social Connections: Moderately Isolated  . Frequency of Communication with Friends and Family: More than three times a week  . Frequency of Social Gatherings with Friends and Family: Twice a week  . Attends Religious Services: Never  . Active Member of Clubs or Organizations: No  . Attends Banker Meetings: Never  . Marital Status: Living with partner    Tobacco Counseling Ready to quit: Not Answered Counseling given: Not Answered   Clinical Intake:  Pre-visit preparation completed: Yes  Pain : No/denies pain     Nutritional Risks: None Diabetes: Yes CBG done?: No Did pt. bring in CBG monitor from home?: No  How often do you need to have someone help you when you read instructions, pamphlets, or other written materials from your doctor or pharmacy?: 1 - Never What is the last grade level you completed in school?: College  Diabetic?Yes Nutrition Risk Assessment:  Has the patient had any N/V/D within the last 2 months?  No  Does the patient have any non-healing wounds?  No  Has the patient had any unintentional weight loss or weight gain?  No   Diabetes:  Is the patient diabetic?  Yes  If diabetic, was a CBG obtained today?  No  Did the patient bring in their glucometer from home?  No  How often do you monitor your CBG's? Patient states does not check glucose often.   Financial Strains and Diabetes Management:  Are you having any financial strains with the device, your supplies or your medication? No .  Does the patient want to be seen by Chronic Care Management for management of their diabetes?  No  Would the patient like to be referred to a Nutritionist or for Diabetic Management?  No   Diabetic Exams:  Diabetic Eye Exam: Completed 07/11/2020 Diabetic Foot Exam:  Completed 05/09/2020   Interpreter Needed?: No  Information entered by :: SCrews,LPN   Activities of Daily Living In your present state of health, do you have any difficulty performing the following activities: 10/03/2020  Hearing? N  Vision? N  Difficulty concentrating or making decisions? N  Walking or climbing stairs? N  Dressing or bathing? N  Doing errands, shopping? N  Preparing Food and eating ? N  Using the Toilet? N  In the past six months, have you accidently leaked urine? Y  Comment Patient has some concerns of loss of bladder control  Do you have problems with loss of bowel control? N  Managing your Medications? N  Managing your Finances? N  Housekeeping or managing your  Housekeeping? N  Some recent data might be hidden    Patient Care Team: Kristian CoveyBurchette, Bruce W, MD as PCP - [facility] (Specialist)  Indicate any recent Medical Services you may have received from other than [facility] providers in the past year (date may be approximate).     Assessment:   This is a routine wellness examination for Adam [name].  Hearing/Vision screen  Hearing Screening   125Hz  250Hz  500Hz  1000Hz  2000Hz  3000Hz  4000Hz  6000Hz  8000Hz   Right ear:           Left ear:           Vision Screening Comments: Patient states gets eye exam yearly   Dietary issues and exercise activities discussed: Current Exercise Habits: The patient does not participate in regular exercise at present  Goals    . Exercise 150 min/wk Moderate Activity    . Patient Stated     [place] farm and healthy      Depression Screen PHQ 2/9 Scores 10/03/2020 10/01/2019 04/26/2018 04/25/2017 04/26/2016 05/09/2014  PHQ - 2 Score 0 0 0 0 0 0  PHQ- 9 Score 0 - - - - -    Fall Risk Fall Risk  10/03/2020 10/01/2019 04/26/2018 04/25/2017 04/26/2016  Falls in the past year? 0 0 No Yes No  Number falls in past yr: 0 0 - 2 or more -  Injury with Fall? 0 - - No -  Risk for fall due to : Medication side effect Medication  side effect - - -  Follow up Falls evaluation completed;Falls prevention discussed Falls evaluation completed;Education provided;Falls prevention discussed - Falls prevention discussed -    FALL RISK PREVENTION PERTAINING TO THE HOME:  Any stairs in or around the home? Yes  If so, are there any without handrails? No  Home free of loose throw rugs in walkways, pet beds, electrical cords, etc? Yes  Adequate lighting in your home to reduce risk of falls? Yes   ASSISTIVE DEVICES UTILIZED TO PREVENT FALLS:  Life alert? No  Use of a cane, walker or w/c? No  Grab bars in the bathroom? No  Shower chair or bench in shower? No  Elevated toilet seat or a handicapped toilet? No   TIMED UP AND GO:  Was the test performed? Yes .  Length of time to ambulate 10 feet: 5 sec.   Gait steady and fast without use of assistive device  Cognitive Function: Normal cognitive status assessed by direct observation by this Nurse Health Advisor. No abnormalities found.   MMSE - Mini Mental State Exam 04/26/2018  Not completed: (No Data)     6CIT Screen 10/01/2019  What Year? 0 points  What month? 0 points  What time? 0 points  Count back from 20 0 points  Months in reverse 0 points  Repeat phrase 0 points  Total Score 0    Immunizations Immunization History  Administered Date(s) Administered  . Moderna SARS-COVID-2 Vaccination 12/27/2019, 01/24/2020  . Pneumococcal Conjugate-13 05/09/2014  . Pneumococcal Polysaccharide-23 04/25/2017    TDAP status: Due, Education has been provided regarding the importance of this vaccine. Advised may receive this vaccine at local pharmacy or Health Dept. Aware to provide a copy of the vaccination record if obtained from local pharmacy or Health Dept. Verbalized acceptance and understanding.  Flu Vaccine status: Declined, Education has been provided regarding the importance of this vaccine but patient still declined. Advised may receive this vaccine at local  pharmacy or Health Dept. Aware  to provide a copy of the vaccination record if obtained from local pharmacy or Health Dept. Verbalized acceptance and understanding.  Pneumococcal vaccine status: Completed during today's visit.  Covid-19 vaccine status: Completed vaccines  Qualifies for Shingles Vaccine? Yes   Zostavax completed No   Shingrix Completed?: No.    Education has been provided regarding the importance of this vaccine. Patient has been advised to call insurance company to determine out of pocket expense if they have not yet received this vaccine. Advised may also receive vaccine at local pharmacy or Health Dept. Verbalized acceptance and understanding.  Screening Tests Health Maintenance  Topic Date Due  . TETANUS/TDAP  Never done  . COVID-19 Vaccine (3 - Booster for Moderna series) 07/25/2020  . COLONOSCOPY  10/03/2021 (Originally 11/26/1998)  . HEMOGLOBIN A1C  10/21/2020  . FOOT EXAM  05/09/2021  . URINE MICROALBUMIN  05/09/2021  . OPHTHALMOLOGY EXAM  07/11/2021  . Hepatitis C Screening  Completed  . PNA vac Low Risk Adult  Completed  . INFLUENZA VACCINE  Discontinued    Health Maintenance  Health Maintenance Due  Topic Date Due  . TETANUS/TDAP  Never done  . COVID-19 Vaccine (3 - Booster for Moderna series) 07/25/2020    Colorectal Cancer Screening: Patient declined. Education was provided on colorectal cancer Lung Cancer Screening: (Low Dose CT Chest recommended if Age 84-80 years, 30 pack-year currently smoking OR have quit w/in 15years.) does qualify.   Lung Cancer Screening Referral: Patient declined referral at this time   Additional Screening:  Hepatitis C Screening: does qualify; Completed 05/02/2017  Vision Screening: Recommended annual ophthalmology exams for early detection of glaucoma and other disorders of the eye. Is the patient up to date with their annual eye exam?  Yes  Who is the provider or what is the name of the office in which the patient  attends annual eye exams? Patient unsure of name of eye doctor  If pt is not established with a provider, would they like to be referred to a provider to establish care? No .   Dental Screening: Recommended annual dental exams for proper oral hygiene  Community Resource Referral / Chronic Care Management: CRR required this visit?  No   CCM required this visit?  No      Plan:     I have personally reviewed and noted the following in the patient's chart:   . Medical and social history . Use of alcohol, tobacco or illicit drugs  . Current medications and supplements . Functional ability and status . Nutritional status . Physical activity . Advanced directives . List of other physicians . Hospitalizations, surgeries, and ER visits in previous 12 months . Vitals . Screenings to include cognitive, depression, and falls . Referrals and appointments  In addition, I have reviewed and discussed with patient certain preventive protocols, quality metrics, and best practice recommendations. A written personalized care plan for preventive services as well as general preventive health recommendations were provided to patient.     Theodora Blow, LPN   [id number]   Nurse Notes: Patient declined CT low dose lung cancer screening

## 2020-10-03 ENCOUNTER — Ambulatory Visit (INDEPENDENT_AMBULATORY_CARE_PROVIDER_SITE_OTHER): Payer: Medicare Other

## 2020-10-03 ENCOUNTER — Other Ambulatory Visit: Payer: Self-pay

## 2020-10-03 VITALS — BP 108/72 | HR 87 | Temp 98.7°F | Ht 72.0 in | Wt 168.0 lb

## 2020-10-03 DIAGNOSIS — Z0000 Encounter for general adult medical examination without abnormal findings: Secondary | ICD-10-CM | POA: Diagnosis not present

## 2020-10-03 NOTE — Patient Instructions (Signed)
Adam [name] , Thank you for taking time to come for your Medicare Wellness Visit. I appreciate your ongoing commitment to your health goals. Please review the following plan we discussed and let me know if I can assist you in the future.   Screening recommendations/referrals: Colonoscopy: Currently due, please let us know if you decide to proceed with any colorectal cancer screening  Recommended yearly ophthalmology/optometry visit for glaucoma screening and checkup Recommended yearly dental visit for hygiene and checkup  Vaccinations: Influenza vaccine: You declined flu vaccination today, if you change your mind and wish to proceed you may schedule and appointment with [place] or obtain flu vaccination at your local pharmacy. Pneumococcal vaccine: Completed series  Tdap vaccine: Currently due, if you wish to receive this vaccine you may contact your insurance company to discuss cost or you may await and injury to receive  Shingles vaccine: Currently due for Shingrix, if you wish to receive we recommend that you do so at your local pharmacy.     Advanced directives: Advance directive discussed with you today. Even though you declined this today please call our office should you change your mind and we can give you the proper paperwork for you to fill out.   Conditions/risks identified: You qualify for a Low Dose Lung Cancer screening. You declined the referral at this visit however if you change your mind please let us know and we will be happy to get you scheduled.   Next appointment: None   Preventive Care 71 Years and Older, Male Preventive care refers to lifestyle choices and visits with your health care provider that can promote health and wellness. What does preventive care include?  A yearly physical exam. This is also called an annual well check.  Dental exams once or twice a year.  Routine eye exams. Ask your health care provider how often you should have your eyes checked.  Personal  lifestyle choices, including:  Daily care of your teeth and gums.  Regular physical activity.  Eating a healthy diet.  Avoiding tobacco and drug use.  Limiting alcohol use.  Practicing safe sex.  Taking low doses of aspirin every day.  Taking vitamin and mineral supplements as recommended by your health care provider. What happens during an annual well check? The services and screenings done by your health care provider during your annual well check will depend on your age, overall health, lifestyle risk factors, and family history of disease. Counseling  Your health care provider may ask you questions about your:  Alcohol use.  Tobacco use.  Drug use.  Emotional well-being.  Home and relationship well-being.  Sexual activity.  Eating habits.  History of falls.  Memory and ability to understand (cognition).  Work and work Astronomer. Screening  You may have the following tests or measurements:  Height, weight, and BMI.  Blood pressure.  Lipid and cholesterol levels. These may be checked every 5 years, or more frequently if you are over 80 years old.  Skin check.  Lung cancer screening. You may have this screening every year starting at age 34 if you have a 30-pack-year history of smoking and currently smoke or have quit within the past 15 years.  Fecal occult blood test (FOBT) of the stool. You may have this test every year starting at age 54.  Flexible sigmoidoscopy or colonoscopy. You may have a sigmoidoscopy every 5 years or a colonoscopy every 10 years starting at age 41.  Prostate cancer screening. Recommendations will vary depending on  your family history and other risks.  Hepatitis C blood test.  Hepatitis B blood test.  Sexually transmitted disease (STD) testing.  Diabetes screening. This is done by checking your blood sugar (glucose) after you have not eaten for a while (fasting). You may have this done every 1-3 years.  Abdominal aortic  aneurysm (AAA) screening. You may need this if you are a current or former smoker.  Osteoporosis. You may be screened starting at age 48 if you are at high risk. Talk with your health care provider about your test results, treatment options, and if necessary, the need for more tests. Vaccines  Your health care provider may recommend certain vaccines, such as:  Influenza vaccine. This is recommended every year.  Tetanus, diphtheria, and acellular pertussis (Tdap, Td) vaccine. You may need a Td booster every 10 years.  Zoster vaccine. You may need this after age 72.  Pneumococcal 13-valent conjugate (PCV13) vaccine. One dose is recommended after age 46.  Pneumococcal polysaccharide (PPSV23) vaccine. One dose is recommended after age 22. Talk to your health care provider about which screenings and vaccines you need and how often you need them. This information is not intended to replace advice given to you by your health care provider. Make sure you discuss any questions you have with your health care provider. Document Released: 11/07/2015 Document Revised: 06/30/2016 Document Reviewed: 08/12/2015 Elsevier Interactive Patient Education  2017 [place] Prevention in the Home Falls can cause injuries. They can happen to people of all ages. There are many things you can do to make your home safe and to help prevent falls. What can I do on the outside of my home?  Regularly fix the edges of walkways and driveways and fix any cracks.  Remove anything that might make you trip as you walk through a door, such as a raised step or threshold.  Trim any bushes or trees on the path to your home.  Use bright outdoor lighting.  Clear any walking paths of anything that might make someone trip, such as rocks or tools.  Regularly check to see if handrails are loose or broken. Make sure that both sides of any steps have handrails.  Any raised decks and porches should have guardrails on  the edges.  Have any leaves, snow, or ice cleared regularly.  Use sand or salt on walking paths during winter.  Clean up any spills in your garage right away. This includes oil or grease spills. What can I do in the bathroom?  Use night lights.  Install grab bars by the toilet and in the tub and shower. Do not use towel bars as grab bars.  Use non-skid mats or decals in the tub or shower.  If you need to sit down in the shower, use a plastic, non-slip stool.  Keep the floor dry. Clean up any water that spills on the floor as soon as it happens.  Remove soap buildup in the tub or shower regularly.  Attach bath mats securely with double-sided non-slip rug tape.  Do not have throw rugs and other things on the floor that can make you trip. What can I do in the bedroom?  Use night lights.  Make sure that you have a light by your bed that is easy to reach.  Do not use any sheets or blankets that are too big for your bed. They should not hang down onto the floor.  Have a firm chair that has side arms. You  can use this for support while you get dressed.  Do not have throw rugs and other things on the floor that can make you trip. What can I do in the kitchen?  Clean up any spills right away.  Avoid walking on wet floors.  Keep items that you use a lot in easy-to-reach places.  If you need to reach something above you, use a strong step stool that has a grab bar.  Keep electrical cords out of the way.  Do not use floor polish or wax that makes floors slippery. If you must use wax, use non-skid floor wax.  Do not have throw rugs and other things on the floor that can make you trip. What can I do with my stairs?  Do not leave any items on the stairs.  Make sure that there are handrails on both sides of the stairs and use them. Fix handrails that are broken or loose. Make sure that handrails are as long as the stairways.  Check any carpeting to make sure that it is firmly  attached to the stairs. Fix any carpet that is loose or worn.  Avoid having throw rugs at the top or bottom of the stairs. If you do have throw rugs, attach them to the floor with carpet tape.  Make sure that you have a light switch at the top of the stairs and the bottom of the stairs. If you do not have them, ask someone to add them for you. What else can I do to help prevent falls?  Wear shoes that:  Do not have high heels.  Have rubber bottoms.  Are comfortable and fit you well.  Are closed at the toe. Do not wear sandals.  If you use a stepladder:  Make sure that it is fully opened. Do not climb a closed stepladder.  Make sure that both sides of the stepladder are locked into place.  Ask someone to hold it for you, if possible.  Clearly mark and make sure that you can see:  Any grab bars or handrails.  First and last steps.  Where the edge of each step is.  Use tools that help you move around (mobility aids) if they are needed. These include:  Canes.  Walkers.  Scooters.  Crutches.  Turn on the lights when you go into a dark area. Replace any light bulbs as soon as they burn out.  Set up your furniture so you have a clear path. Avoid moving your furniture around.  If any of your floors are uneven, fix them.  If there are any pets around you, be aware of where they are.  Review your medicines with your doctor. Some medicines can make you feel dizzy. This can increase your chance of falling. Ask your doctor what other things that you can do to help prevent falls. This information is not intended to replace advice given to you by your health care provider. Make sure you discuss any questions you have with your health care provider. Document Released: 08/07/2009 Document Revised: 03/18/2016 Document Reviewed: 11/15/2014 Elsevier Interactive Patient Education  2017 [employer].

## 2020-12-27 ENCOUNTER — Other Ambulatory Visit: Payer: Self-pay | Admitting: Family Medicine

## 2021-01-12 ENCOUNTER — Other Ambulatory Visit: Payer: Self-pay | Admitting: Family Medicine

## 2021-04-09 ENCOUNTER — Other Ambulatory Visit: Payer: Self-pay | Admitting: Family Medicine

## 2021-04-19 ENCOUNTER — Other Ambulatory Visit: Payer: Self-pay | Admitting: Family Medicine

## 2021-06-26 ENCOUNTER — Other Ambulatory Visit: Payer: Self-pay

## 2021-06-26 ENCOUNTER — Other Ambulatory Visit (INDEPENDENT_AMBULATORY_CARE_PROVIDER_SITE_OTHER): Payer: Medicare Other

## 2021-06-26 DIAGNOSIS — E113299 Type 2 diabetes mellitus with mild nonproliferative diabetic retinopathy without macular edema, unspecified eye: Secondary | ICD-10-CM | POA: Diagnosis not present

## 2021-06-26 DIAGNOSIS — E11319 Type 2 diabetes mellitus with unspecified diabetic retinopathy without macular edema: Secondary | ICD-10-CM

## 2021-06-26 LAB — HEMOGLOBIN A1C: Hgb A1c MFr Bld: 8.6 % — ABNORMAL HIGH (ref 4.6–6.5)

## 2021-07-06 DIAGNOSIS — M545 Low back pain, unspecified: Secondary | ICD-10-CM | POA: Diagnosis not present

## 2021-07-06 DIAGNOSIS — M5136 Other intervertebral disc degeneration, lumbar region: Secondary | ICD-10-CM | POA: Diagnosis not present

## 2021-07-06 DIAGNOSIS — M47816 Spondylosis without myelopathy or radiculopathy, lumbar region: Secondary | ICD-10-CM | POA: Diagnosis not present

## 2021-07-06 DIAGNOSIS — M4856XA Collapsed vertebra, not elsewhere classified, lumbar region, initial encounter for fracture: Secondary | ICD-10-CM | POA: Diagnosis not present

## 2021-07-18 DIAGNOSIS — Z6824 Body mass index (BMI) 24.0-24.9, adult: Secondary | ICD-10-CM | POA: Diagnosis not present

## 2021-07-18 DIAGNOSIS — M62830 Muscle spasm of back: Secondary | ICD-10-CM | POA: Diagnosis not present

## 2021-07-18 DIAGNOSIS — M545 Low back pain, unspecified: Secondary | ICD-10-CM | POA: Diagnosis not present

## 2021-07-20 DIAGNOSIS — M542 Cervicalgia: Secondary | ICD-10-CM | POA: Diagnosis not present

## 2021-07-20 DIAGNOSIS — M549 Dorsalgia, unspecified: Secondary | ICD-10-CM | POA: Diagnosis not present

## 2021-07-20 DIAGNOSIS — M25511 Pain in right shoulder: Secondary | ICD-10-CM | POA: Diagnosis not present

## 2021-07-27 ENCOUNTER — Other Ambulatory Visit: Payer: Self-pay | Admitting: Family Medicine

## 2021-07-29 ENCOUNTER — Other Ambulatory Visit: Payer: Self-pay | Admitting: Family Medicine

## 2021-07-29 ENCOUNTER — Telehealth: Payer: Self-pay | Admitting: Family Medicine

## 2021-07-29 NOTE — Telephone Encounter (Signed)
disregard

## 2021-07-31 ENCOUNTER — Encounter: Payer: Self-pay | Admitting: Family Medicine

## 2021-07-31 ENCOUNTER — Other Ambulatory Visit: Payer: Self-pay

## 2021-07-31 ENCOUNTER — Ambulatory Visit (INDEPENDENT_AMBULATORY_CARE_PROVIDER_SITE_OTHER): Payer: Medicare Other | Admitting: Family Medicine

## 2021-07-31 VITALS — BP 140/70 | HR 82 | Temp 98.2°F | Wt 161.4 lb

## 2021-07-31 DIAGNOSIS — E113299 Type 2 diabetes mellitus with mild nonproliferative diabetic retinopathy without macular edema, unspecified eye: Secondary | ICD-10-CM

## 2021-07-31 DIAGNOSIS — M810 Age-related osteoporosis without current pathological fracture: Secondary | ICD-10-CM | POA: Diagnosis not present

## 2021-07-31 DIAGNOSIS — E785 Hyperlipidemia, unspecified: Secondary | ICD-10-CM

## 2021-07-31 LAB — LIPID PANEL
Cholesterol: 157 mg/dL (ref 0–200)
HDL: 45.7 mg/dL (ref >39.00)
LDL Cholesterol: 90 mg/dL (ref 0–99)
NonHDL: 111.17
Total CHOL/HDL Ratio: 3
Triglycerides: 108 mg/dL (ref 0.0–149.0)
VLDL: 21.6 mg/dL (ref 0.0–40.0)

## 2021-07-31 LAB — HEPATIC FUNCTION PANEL
ALT: 9 U/L (ref 0–53)
AST: 14 U/L (ref 0–37)
Albumin: 4 g/dL (ref 3.5–5.2)
Alkaline Phosphatase: 64 U/L (ref 39–117)
Bilirubin, Direct: 0.1 mg/dL (ref 0.0–0.3)
Total Bilirubin: 0.8 mg/dL (ref 0.2–1.2)
Total Protein: 6.9 g/dL (ref 6.0–8.3)

## 2021-07-31 LAB — BASIC METABOLIC PANEL
BUN: 14 mg/dL (ref 6–23)
CO2: 28 mEq/L (ref 19–32)
Calcium: 9.3 mg/dL (ref 8.4–10.5)
Chloride: 101 mEq/L (ref 96–112)
Creatinine, Ser: 1.27 mg/dL (ref 0.40–1.50)
GFR: 56.38 mL/min — ABNORMAL LOW (ref >60.00)
Glucose, Bld: 300 mg/dL — ABNORMAL HIGH (ref 70–99)
Potassium: 4.7 mEq/L (ref 3.5–5.1)
Sodium: 138 mEq/L (ref 135–145)

## 2021-07-31 MED ORDER — GLIMEPIRIDE 2 MG PO TABS: 2.0000 mg | ORAL_TABLET | Freq: Every day | ORAL | 3 refills | Status: DC

## 2021-07-31 MED ORDER — TAMSULOSIN HCL 0.4 MG PO CAPS: 0.4000 mg | ORAL_CAPSULE | Freq: Every day | ORAL | 3 refills | Status: DC

## 2021-07-31 NOTE — Progress Notes (Signed)
Established Patient Office Visit  Subjective:  Patient ID: Adam [name], male    DOB: 1950-12-28  Age: 70 y.o. MRN: [id number]  CC:  Chief Complaint  Patient presents with   Medication Refill    HPI Adam [name] presents for medical follow-up.  He has history of type 2 diabetes, peripheral vascular disease, hyperlipidemia.  Ongoing nicotine use.  Recent A1c 8.6%.  He seems uncertain regarding medications.  He does bring an empty bottle of glimepiride and states he has been out for about a week.  Not currently taking Fosamax which he was started on for osteoporosis over a year ago.  He does take aspirin and Lipitor.  He thinks he is on metformin but not sure about Jardiance.  Very poor compliance with low glycemic diet.  He drinks sweet tea several times per day and frequently consumes high carb foods such as potatoes and rice.  He states he sometimes consumes potatoes more than once per day.  He is having some increased difficulties with slow urine stream.  No significant nocturia.  Rare burning with urination.  No anticholinergic medications.  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   PERIPHERAL VASCULAR DISEASE 08/27/2010   WEIGHT LOSS 08/27/2010    Past Surgical History:  Procedure Laterality Date   BACK SURGERY  2003   CHOLECYSTECTOMY  1990   KNEE SURGERY  1995   left    Family History  Problem Relation Age of Onset   Heart disease Father    Cancer Paternal Grandfather        lung    Social History   Socioeconomic History   Marital status: Single    Spouse name: Not on file   Number of children: 3   Years of education: 4 years college   Highest education level: Bachelor's degree (e.g., BA, AB, BS)  Occupational History   Not on file  Tobacco Use   Smoking status: Every Day    Packs/day: 1.00    Years: 50.00    Pack years: 50.00    Types: Cigarettes    Smokeless tobacco: Never  Vaping Use   Vaping Use: Never used  Substance and Sexual Activity   Alcohol use: Not Currently    Alcohol/week: 0.0 standard drinks    Comment: occasional   Drug use: No   Sexual activity: Not on file  Other Topics Concern   Not on file  Social History Narrative   Not on file   Social Determinants of Health   Financial Resource Strain: Low Risk    Difficulty of Paying Living Expenses: Not hard at all  Food Insecurity: No Food Insecurity   Worried About Programme researcher, broadcasting/film/video in the Last Year: Never true   Ran Out of Food in the Last Year: Never true  Transportation Needs: No Transportation Needs   Lack of Transportation (Medical): No   Lack of Transportation (Non-Medical): No  Physical Activity: Inactive   Days of Exercise per Week: 0 days   Minutes of Exercise per Session: 0 min  Stress: No Stress Concern Present   Feeling of Stress : Not at all  Social Connections: Moderately Isolated   Frequency of Communication with Friends and Family: More than three times a week   Frequency of Social Gatherings with Friends and Family: Twice a week   Attends Religious Services: Never   Production manager of [employer]  or Organizations: No   Attends Banker Meetings: Never   Marital Status: Living with partner  Intimate Partner Violence: Not At Risk   Fear of Current or Ex-Partner: No   Emotionally Abused: No   Physically Abused: No   Sexually Abused: No    Outpatient Medications Prior to Visit  Medication Sig Dispense Refill   alendronate (FOSAMAX) 70 MG tablet Take 1 tablet (70 mg total) by mouth every 7 (seven) days. Take with a full glass of water on an empty stomach. 12 tablet 3   aspirin 325 MG tablet Take 650 mg by mouth daily.     atorvastatin (LIPITOR) 40 MG tablet Take 1 tablet by mouth once daily 90 tablet 0   glucose blood (ACCU-CHEK AVIVA PLUS) test strip Use to check blood sugars once daily. Dx: E11.65 100 each 2   JARDIANCE 10 MG TABS  tablet TAKE 1 TABLET BY MOUTH ONCE DAILY BEFORE BREAKFAST 90 tablet 0   Lancet Devices (ACCU-CHEK SOFTCLIX) lancets Use to check blood sugars once daily. Dx: E11.65 100 each 2   metFORMIN (GLUCOPHAGE) 500 MG tablet Take 2 tablets by mouth twice daily 360 tablet 0   ondansetron (ZOFRAN) 4 MG tablet      ONE TOUCH LANCETS MISC Use 3-4 times as week as directed     sildenafil (REVATIO) 20 MG tablet Take 2 to 5 tablets one hour prior to sexual activity. 50 tablet 3   glimepiride (AMARYL) 2 MG tablet Take 1 tablet by mouth once daily with breakfast 90 tablet 0   No facility-administered medications prior to visit.    Allergies  Allergen Reactions   Naproxen Sodium Swelling    ROS Review of Systems  Constitutional:  Negative for chills and fever.  Respiratory:  Negative for cough and shortness of breath.   Cardiovascular:  Negative for chest pain.  Gastrointestinal:  Negative for abdominal pain.  Genitourinary:  Positive for difficulty urinating and frequency.     Objective:    Physical Exam Vitals reviewed.  Constitutional:      Appearance: He is obese.  Cardiovascular:     Rate and Rhythm: Normal rate and regular rhythm.  Pulmonary:     Effort: Pulmonary effort is normal.     Breath sounds: Normal breath sounds.  Neurological:     Mental Status: He is alert.    BP 140/70 (BP Location: Left Arm, Patient Position: Sitting, Cuff Size: Normal)   Pulse 82   Temp 98.2 F (36.8 C) (Oral)   Wt 161 lb 6.4 oz (73.2 kg)   SpO2 98%   BMI 21.89 kg/m  Wt Readings from Last 3 Encounters:  07/31/21 161 lb 6.4 oz (73.2 kg)  10/03/20 168 lb (76.2 kg)  05/09/20 163 lb 1.6 oz (74 kg)     Health Maintenance Due  Topic Date Due   TETANUS/TDAP  Never done   Zoster Vaccines- Shingrix (1 of 2) Never done   COVID-19 Vaccine (3 - Booster for Moderna series) 06/25/2020   FOOT EXAM  05/09/2021   URINE MICROALBUMIN  05/09/2021   OPHTHALMOLOGY EXAM  07/11/2021    There are no preventive  care reminders to display for this patient.  Lab Results  Component Value Date   TSH 0.93 08/23/2018   Lab Results  Component Value Date   WBC 12.0 (H) 08/27/2010   HGB 17.9 (H) 08/27/2010   HCT 51.2 08/27/2010   MCV 91.3 08/27/2010   PLT 209.0 08/27/2010   Lab Results  Component Value Date   NA 138 11/16/2019   K 4.9 11/16/2019   CO2 28 11/16/2019   GLUCOSE 252 (H) 11/16/2019   BUN 9 11/16/2019   CREATININE 1.06 11/16/2019   BILITOT 0.6 02/22/2020   ALKPHOS 57 02/22/2020   AST 11 02/22/2020   ALT 7 02/22/2020   PROT 6.7 02/22/2020   ALBUMIN 4.3 02/22/2020   CALCIUM 9.9 11/16/2019   GFR 68.88 11/16/2019   Lab Results  Component Value Date   CHOL 183 02/22/2020   Lab Results  Component Value Date   HDL 45.40 02/22/2020   Lab Results  Component Value Date   LDLCALC 107 (H) 02/22/2020   Lab Results  Component Value Date   TRIG 151.0 (H) 02/22/2020   Lab Results  Component Value Date   CHOLHDL 4 02/22/2020   Lab Results  Component Value Date   HGBA1C 8.6 (H) 06/26/2021      Assessment & Plan:   #1 type 2 diabetes poorly controlled with recent A1c 8.6%.  Not clear if she is taking Jardiance.  We refilled his glimepiride.  We have advised that he confirm if he is taking Jardiance.  If so we will consider increasing him to 25 mg daily.  If not, get back on Jardiance once daily.  We had a long talk regarding diet.  We strongly advise he try to scale back sweetened tea, rice, potatoes and other high glycemic foods -Reassess A1c in 3 months  #2 hyperlipidemia.  Patient on atorvastatin.  Needs follow-up lipids.  Goal LDL less than 70. -Recheck lipid and hepatic panel today  #3 slow urinary stream.  Suspected BPH.  Discussed trial of Flomax 0.4 mg nightly.  Be in touch if not improving with this.   Meds ordered this encounter  Medications   glimepiride (AMARYL) 2 MG tablet    Sig: Take 1 tablet (2 mg total) by mouth daily with breakfast.    Dispense:  90  tablet    Refill:  3   tamsulosin (FLOMAX) 0.4 MG CAPS capsule    Sig: Take 1 capsule (0.4 mg total) by mouth daily.    Dispense:  30 capsule    Refill:  3    Follow-up: Return in about 3 months (around 10/31/2021).    Evelena Peat, MD

## 2021-07-31 NOTE — Patient Instructions (Signed)
Confirm if you are taking the Jardiance  If not, let me know so I can refill.    Try to scale back on sweet tea, rice, potatoes.

## 2021-08-13 ENCOUNTER — Telehealth: Payer: Self-pay

## 2021-08-13 MED ORDER — ATORVASTATIN CALCIUM 40 MG PO TABS: 40.0000 mg | ORAL_TABLET | Freq: Every day | ORAL | 1 refills | Status: DC

## 2021-08-13 MED ORDER — METFORMIN HCL 500 MG PO TABS: 1000.0000 mg | ORAL_TABLET | Freq: Two times a day (BID) | ORAL | 1 refills | Status: DC

## 2021-08-13 NOTE — Telephone Encounter (Signed)
Last OV 07/31/21 with recommendation to Return in about 3 months (around 10/31/2021).  Pt states that he is getting low in the Atorvastatin & Metformin. Will refill both to pharmacy on file per pt request.

## 2021-08-17 DIAGNOSIS — M25511 Pain in right shoulder: Secondary | ICD-10-CM | POA: Diagnosis not present

## 2021-08-17 DIAGNOSIS — M549 Dorsalgia, unspecified: Secondary | ICD-10-CM | POA: Diagnosis not present

## 2021-08-17 DIAGNOSIS — M542 Cervicalgia: Secondary | ICD-10-CM | POA: Diagnosis not present

## 2021-08-19 DIAGNOSIS — Z6824 Body mass index (BMI) 24.0-24.9, adult: Secondary | ICD-10-CM | POA: Diagnosis not present

## 2021-08-19 DIAGNOSIS — J069 Acute upper respiratory infection, unspecified: Secondary | ICD-10-CM | POA: Diagnosis not present

## 2021-08-31 DIAGNOSIS — M25511 Pain in right shoulder: Secondary | ICD-10-CM | POA: Diagnosis not present

## 2021-08-31 DIAGNOSIS — M542 Cervicalgia: Secondary | ICD-10-CM | POA: Diagnosis not present

## 2021-08-31 DIAGNOSIS — M549 Dorsalgia, unspecified: Secondary | ICD-10-CM | POA: Diagnosis not present

## 2021-09-28 ENCOUNTER — Ambulatory Visit (INDEPENDENT_AMBULATORY_CARE_PROVIDER_SITE_OTHER): Payer: Medicare Other

## 2021-09-28 VITALS — Ht 70.5 in | Wt 165.0 lb

## 2021-09-28 DIAGNOSIS — Z0000 Encounter for general adult medical examination without abnormal findings: Secondary | ICD-10-CM

## 2021-09-28 DIAGNOSIS — Z122 Encounter for screening for malignant neoplasm of respiratory organs: Secondary | ICD-10-CM

## 2021-09-28 DIAGNOSIS — F17200 Nicotine dependence, unspecified, uncomplicated: Secondary | ICD-10-CM

## 2021-09-28 NOTE — Patient Instructions (Signed)
[name] , Thank you for taking time to come for your Medicare Wellness Visit. I appreciate your ongoing commitment to your health goals. Please review the following plan we discussed and let me know if I can assist you in the future.   Screening recommendations/referrals: Colonoscopy: decline Recommended yearly ophthalmology/optometry visit for glaucoma screening and checkup Recommended yearly dental visit for hygiene and checkup  Vaccinations: Influenza vaccine: decline Pneumococcal vaccine: completed 04/25/2017 Tdap vaccine: due Shingles vaccine: discussed   Covid-19:  01/24/2020, 12/27/2019  Advanced directives: Advance directive discussed with you today.   Conditions/risks identified: smoking  Next appointment: Follow up in one year for your annual wellness visit.   Preventive Care 72 Years and Older, Male Preventive care refers to lifestyle choices and visits with your health care provider that can promote health and wellness. What does preventive care include? A yearly physical exam. This is also called an annual well check. Dental exams once or twice a year. Routine eye exams. Ask your health care provider how often you should have your eyes checked. Personal lifestyle choices, including: Daily care of your teeth and gums. Regular physical activity. Eating a healthy diet. Avoiding tobacco and drug use. Limiting alcohol use. Practicing safe sex. Taking low doses of aspirin every day. Taking vitamin and mineral supplements as recommended by your health care provider. What happens during an annual well check? The services and screenings done by your health care provider during your annual well check will depend on your age, overall health, lifestyle risk factors, and family history of disease. Counseling  Your health care provider may ask you questions about your: Alcohol use. Tobacco use. Drug use. Emotional well-being. Home and relationship well-being. Sexual  activity. Eating habits. History of falls. Memory and ability to understand (cognition). Work and work Astronomer. Screening  You may have the following tests or measurements: Height, weight, and BMI. Blood pressure. Lipid and cholesterol levels. These may be checked every 5 years, or more frequently if you are over 85 years old. Skin check. Lung cancer screening. You may have this screening every year starting at age 29 if you have a 30-pack-year history of smoking and currently smoke or have quit within the past 15 years. Fecal occult blood test (FOBT) of the stool. You may have this test every year starting at age 78. Flexible sigmoidoscopy or colonoscopy. You may have a sigmoidoscopy every 5 years or a colonoscopy every 10 years starting at age 42. Prostate cancer screening. Recommendations will vary depending on your family history and other risks. Hepatitis C blood test. Hepatitis B blood test. Sexually transmitted disease (STD) testing. Diabetes screening. This is done by checking your blood sugar (glucose) after you have not eaten for a while (fasting). You may have this done every 1-3 years. Abdominal aortic aneurysm (AAA) screening. You may need this if you are a current or former smoker. Osteoporosis. You may be screened starting at age 23 if you are at high risk. Talk with your health care provider about your test results, treatment options, and if necessary, the need for more tests. Vaccines  Your health care provider may recommend certain vaccines, such as: Influenza vaccine. This is recommended every year. Tetanus, diphtheria, and acellular pertussis (Tdap, Td) vaccine. You may need a Td booster every 10 years. Zoster vaccine. You may need this after age 15. Pneumococcal 13-valent conjugate (PCV13) vaccine. One dose is recommended after age 46. Pneumococcal polysaccharide (PPSV23) vaccine. One dose is recommended after age 98. Talk to your  health care provider about which  screenings and vaccines you need and how often you need them. This information is not intended to replace advice given to you by your health care provider. Make sure you discuss any questions you have with your health care provider. Document Released: 11/07/2015 Document Revised: 06/30/2016 Document Reviewed: 08/12/2015 Elsevier Interactive Patient Education  2017 [employer] Prevention in the Home Falls can cause injuries. They can happen to people of all ages. There are many things you can do to make your home safe and to help prevent falls. What can I do on the outside of my home? Regularly fix the edges of walkways and driveways and fix any cracks. Remove anything that might make you trip as you walk through a door, such as a raised step or threshold. Trim any bushes or trees on the path to your home. Use bright outdoor lighting. Clear any walking paths of anything that might make someone trip, such as rocks or tools. Regularly check to see if handrails are loose or broken. Make sure that both sides of any steps have handrails. Any raised decks and porches should have guardrails on the edges. Have any leaves, snow, or ice cleared regularly. Use sand or salt on walking paths during winter. Clean up any spills in your garage right away. This includes oil or grease spills. What can I do in the bathroom? Use night lights. Install grab bars by the toilet and in the tub and shower. Do not use towel bars as grab bars. Use non-skid mats or decals in the tub or shower. If you need to sit down in the shower, use a plastic, non-slip stool. Keep the floor dry. Clean up any water that spills on the floor as soon as it happens. Remove soap buildup in the tub or shower regularly. Attach bath mats securely with double-sided non-slip rug tape. Do not have throw rugs and other things on the floor that can make you trip. What can I do in the bedroom? Use night lights. Make sure that you have a  light by your bed that is easy to reach. Do not use any sheets or blankets that are too big for your bed. They should not hang down onto the floor. Have a firm chair that has side arms. You can use this for support while you get dressed. Do not have throw rugs and other things on the floor that can make you trip. What can I do in the kitchen? Clean up any spills right away. Avoid walking on wet floors. Keep items that you use a lot in easy-to-reach places. If you need to reach something above you, use a strong step stool that has a grab bar. Keep electrical cords out of the way. Do not use floor polish or wax that makes floors slippery. If you must use wax, use non-skid floor wax. Do not have throw rugs and other things on the floor that can make you trip. What can I do with my stairs? Do not leave any items on the stairs. Make sure that there are handrails on both sides of the stairs and use them. Fix handrails that are broken or loose. Make sure that handrails are as long as the stairways. Check any carpeting to make sure that it is firmly attached to the stairs. Fix any carpet that is loose or worn. Avoid having throw rugs at the top or bottom of the stairs. If you do have throw rugs, attach them  to the floor with carpet tape. Make sure that you have a light switch at the top of the stairs and the bottom of the stairs. If you do not have them, ask someone to add them for you. What else can I do to help prevent falls? Wear shoes that: Do not have high heels. Have rubber bottoms. Are comfortable and fit you well. Are closed at the toe. Do not wear sandals. If you use a stepladder: Make sure that it is fully opened. Do not climb a closed stepladder. Make sure that both sides of the stepladder are locked into place. Ask someone to hold it for you, if possible. Clearly mark and make sure that you can see: Any grab bars or handrails. First and last steps. Where the edge of each step  is. Use tools that help you move around (mobility aids) if they are needed. These include: Canes. Walkers. Scooters. Crutches. Turn on the lights when you go into a dark area. Replace any light bulbs as soon as they burn out. Set up your furniture so you have a clear path. Avoid moving your furniture around. If any of your floors are uneven, fix them. If there are any pets around you, be aware of where they are. Review your medicines with your doctor. Some medicines can make you feel dizzy. This can increase your chance of falling. Ask your doctor what other things that you can do to help prevent falls. This information is not intended to replace advice given to you by your health care provider. Make sure you discuss any questions you have with your health care provider. Document Released: 08/07/2009 Document Revised: 03/18/2016 Document Reviewed: 11/15/2014 Elsevier Interactive Patient Education  2017 [employer].

## 2021-09-28 NOTE — Progress Notes (Signed)
I connected with Tiney Rouge today by telephone and verified that I am speaking with the correct person using two identifiers. Location patient: home Location provider: work Persons participating in the virtual visit: Bo Mcclintock LPN.   I discussed the limitations, risks, security and privacy concerns of performing an evaluation and management service by telephone and the availability of in person appointments. I also discussed with the patient that there may be a patient responsible charge related to this service. The patient expressed understanding and verbally consented to this telephonic visit.    Interactive audio and video telecommunications were attempted between this provider and patient, however failed, due to patient having technical difficulties OR patient did not have access to video capability.  We continued and completed visit with audio only.     Vital signs may be patient reported or missing.  Subjective:   Adam [name] is a 70 y.o. male who presents for Medicare Annual/Subsequent preventive examination.  Review of Systems     Cardiac Risk Factors include: advanced age (>14men, >49 women);diabetes mellitus;dyslipidemia;male gender;smoking/ tobacco exposure     Objective:    Today's Vitals   09/28/21 0857 09/28/21 0859  Weight: 165 lb (74.8 kg)   Height: 5' 10.5" (1.791 m)   PainSc:  7    Body mass index is 23.34 kg/m.  Advanced Directives 09/28/2021 10/03/2020 10/01/2019 04/26/2018 04/25/2017 01/20/2015 11/18/2014  Does Patient Have a Medical Advance Directive? No No No No No No No  Would patient like information on creating a medical advance directive? - No - Patient declined Yes (MAU/Ambulatory/Procedural Areas - Information given) - No - Patient declined No - patient declined information No - patient declined information    Current Medications (verified) Outpatient Encounter Medications as of 09/28/2021  Medication Sig   aspirin 325 MG tablet Take 650  mg by mouth daily.   atorvastatin (LIPITOR) 40 MG tablet Take 1 tablet (40 mg total) by mouth daily.   glimepiride (AMARYL) 2 MG tablet Take 1 tablet (2 mg total) by mouth daily with breakfast.   metFORMIN (GLUCOPHAGE) 500 MG tablet Take 2 tablets (1,000 mg total) by mouth 2 (two) times daily.   alendronate (FOSAMAX) 70 MG tablet Take 1 tablet (70 mg total) by mouth every 7 (seven) days. Take with a full glass of water on an empty stomach.   glucose blood (ACCU-CHEK AVIVA PLUS) test strip Use to check blood sugars once daily. Dx: E11.65 (Patient not taking: Reported on 09/28/2021)   JARDIANCE 10 MG TABS tablet TAKE 1 TABLET BY MOUTH ONCE DAILY BEFORE BREAKFAST (Patient not taking: Reported on 09/28/2021)   Lancet Devices (ACCU-CHEK SOFTCLIX) lancets Use to check blood sugars once daily. Dx: E11.65 (Patient not taking: Reported on 09/28/2021)   ondansetron (ZOFRAN) 4 MG tablet  (Patient not taking: Reported on 09/28/2021)   ONE TOUCH LANCETS MISC Use 3-4 times as week as directed (Patient not taking: Reported on 09/28/2021)   sildenafil (REVATIO) 20 MG tablet Take 2 to 5 tablets one hour prior to sexual activity. (Patient not taking: Reported on 09/28/2021)   tamsulosin (FLOMAX) 0.4 MG CAPS capsule Take 1 capsule (0.4 mg total) by mouth daily. (Patient not taking: Reported on 09/28/2021)   No facility-administered encounter medications on file as of 09/28/2021.    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   PERIPHERAL VASCULAR DISEASE  08/27/2010   WEIGHT LOSS 08/27/2010   Past Surgical History:  Procedure Laterality Date   BACK SURGERY  2003   [place]   left   Family History  Problem Relation Age of Onset   Heart disease Father    Cancer Paternal Grandfather        lung   Social History    Socioeconomic History   Marital status: Single    Spouse name: Not on file   Number of children: 3   Years of education: 4 years college   Highest education level: Bachelor's degree (e.g., BA, AB, BS)  Occupational History   Not on file  Tobacco Use   Smoking status: Every Day    Packs/day: 1.00    Years: 50.00    Pack years: 50.00    Types: Cigarettes   Smokeless tobacco: Never  Vaping Use   Vaping Use: Never used  Substance and Sexual Activity   Alcohol use: Not Currently    Alcohol/week: 0.0 standard drinks    Comment: occasional   Drug use: No   Sexual activity: Not on file  Other Topics Concern   Not on file  Social History Narrative   Not on file   Social Determinants of Health   Financial Resource Strain: Low Risk    Difficulty of Paying Living Expenses: Not hard at all  Food Insecurity: No Food Insecurity   Worried About Charity fundraiser in the Last Year: Never true   Ran Out of Food in the Last Year: Never true  Transportation Needs: No Transportation Needs   Lack of Transportation (Medical): No   Lack of Transportation (Non-Medical): No  Physical Activity: Inactive   Days of Exercise per Week: 0 days   Minutes of Exercise per Session: 0 min  Stress: No Stress Concern Present   Feeling of Stress : Not at all  Social Connections: Moderately Isolated   Frequency of Communication with Friends and Family: More than three times a week   Frequency of Social Gatherings with Friends and Family: Twice a week   Attends Religious Services: Never   Marine scientist or Organizations: No   Attends Music therapist: Never   Marital Status: Living with partner    Tobacco Counseling Ready to quit: No Counseling given: Not Answered   Clinical Intake:  Pre-visit preparation completed: Yes  Pain : 0-10 Pain Score: 7  Pain Type: Chronic pain Pain Location: Back Pain Orientation: Lower Pain Descriptors / Indicators: Aching Pain Onset:  More than a month ago Pain Frequency: Constant     Nutritional Status: BMI of 19-24  Normal Nutritional Risks: None Diabetes: Yes  How often do you need to have someone help you when you read instructions, pamphlets, or other written materials from your doctor or pharmacy?: 1 - Never What is the last grade level you completed in school?: college  Diabetic?yes Nutrition Risk Assessment:  Has the patient had any N/V/D within the last 2 months?  No  Does the patient have any non-healing wounds?  No  Has the patient had any unintentional weight loss or weight gain?  No   Diabetes:  Is the patient diabetic?  Yes  If diabetic, was a CBG obtained today?  No  Did the patient bring in their glucometer from home?  No  How often do you monitor your CBG's? Does not.   Financial Strains and Diabetes Management:  Are you having  any financial strains with the device, your supplies or your medication? No .  Does the patient want to be seen by Chronic Care Management for management of their diabetes?  No  Would the patient like to be referred to a Nutritionist or for Diabetic Management?  No   Diabetic Exams:  Diabetic Eye Exam: Overdue for diabetic eye exam. Pt has been advised about the importance in completing this exam. Patient advised to call and schedule an eye exam. Diabetic Foot Exam: Overdue, Pt has been advised about the importance in completing this exam. Pt is scheduled for diabetic foot exam on next appointment.   Interpreter Needed?: No  Information entered by :: NAllen LPN   Activities of Daily Living In your present state of health, do you have any difficulty performing the following activities: 09/28/2021 10/03/2020  Hearing? N N  Vision? N N  Difficulty concentrating or making decisions? N N  Walking or climbing stairs? N N  Dressing or bathing? N N  Doing errands, shopping? N N  Preparing Food and eating ? N N  Using the Toilet? N N  In the past six months, have  you accidently leaked urine? Y Y  Comment - Patient has some concerns of loss of bladder control  Do you have problems with loss of bowel control? N N  Managing your Medications? N N  Managing your Finances? N N  Housekeeping or managing your Housekeeping? N N  Some recent data might be hidden    Patient Care Team: Kristian Covey, MD as PCP - General Ortho, [facility] (Specialist)  Indicate any recent Medical Services you may have received from other than [facility] providers in the past year (date may be approximate).     Assessment:   This is a routine wellness examination for Spiros.  Hearing/Vision screen Vision Screening - Comments:: No regular eye exams  Dietary issues and exercise activities discussed: Current Exercise Habits: The patient does not participate in regular exercise at present   Goals Addressed             This Visit's Progress    Patient Stated       09/28/2021, get better       Depression Screen PHQ 2/9 Scores 09/28/2021 10/03/2020 10/01/2019 04/26/2018 04/25/2017 04/26/2016 05/09/2014  PHQ - 2 Score 0 0 0 0 0 0 0  PHQ- 9 Score - 0 - - - - -    Fall Risk Fall Risk  09/28/2021 10/03/2020 10/01/2019 04/26/2018 04/25/2017  Falls in the past year? 0 0 0 No Yes  Number falls in past yr: - 0 0 - 2 or more  Injury with Fall? - 0 - - No  Risk for fall due to : Medication side effect Medication side effect Medication side effect - -  Follow up Falls evaluation completed;Education provided;Falls prevention discussed Falls evaluation completed;Falls prevention discussed Falls evaluation completed;Education provided;Falls prevention discussed - Falls prevention discussed    FALL RISK PREVENTION PERTAINING TO THE HOME:  Any stairs in or around the home? Yes  If so, are there any without handrails? No  Home free of loose throw rugs in walkways, pet beds, electrical cords, etc? Yes  Adequate lighting in your home to reduce risk of falls? Yes   ASSISTIVE DEVICES UTILIZED TO  PREVENT FALLS:  Life alert? No  Use of a cane, walker or w/c? No  Grab bars in the bathroom? No  Shower chair or bench in shower? No  Elevated toilet seat  or a handicapped toilet? Yes   TIMED UP AND GO:  Was the test performed? No .      Cognitive Function: MMSE - Mini Mental State Exam 04/26/2018  Not completed: (No Data)     6CIT Screen 09/28/2021 10/01/2019  What Year? 0 points 0 points  What month? 0 points 0 points  What time? 0 points 0 points  Count back from 20 0 points 0 points  Months in reverse 2 points 0 points  Repeat phrase 4 points 0 points  Total Score 6 0    Immunizations Immunization History  Administered Date(s) Administered   Moderna Sars-Covid-2 Vaccination 12/27/2019, 01/24/2020   Pneumococcal Conjugate-13 05/09/2014   Pneumococcal Polysaccharide-23 04/25/2017    TDAP status: Due, Education has been provided regarding the importance of this vaccine. Advised may receive this vaccine at local pharmacy or Health Dept. Aware to provide a copy of the vaccination record if obtained from local pharmacy or Health Dept. Verbalized acceptance and understanding.  Flu Vaccine status: Declined, Education has been provided regarding the importance of this vaccine but patient still declined. Advised may receive this vaccine at local pharmacy or Health Dept. Aware to provide a copy of the vaccination record if obtained from local pharmacy or Health Dept. Verbalized acceptance and understanding.  Pneumococcal vaccine status: Up to date  Covid-19 vaccine status: Completed vaccines  Qualifies for Shingles Vaccine? Yes   Zostavax completed No   Shingrix Completed?: No.    Education has been provided regarding the importance of this vaccine. Patient has been advised to call insurance company to determine out of pocket expense if they have not yet received this vaccine. Advised may also receive vaccine at local pharmacy or Health Dept. Verbalized acceptance and  understanding.  Screening Tests Health Maintenance  Topic Date Due   TETANUS/TDAP  Never done   Zoster Vaccines- Shingrix (1 of 2) Never done   COVID-19 Vaccine (3 - Booster for Moderna series) 03/20/2020   FOOT EXAM  05/09/2021   URINE MICROALBUMIN  05/09/2021   OPHTHALMOLOGY EXAM  07/11/2021   COLONOSCOPY (Pts 45-20yrs Insurance coverage will need to be confirmed)  10/03/2021 (Originally 11/26/1993)   HEMOGLOBIN A1C  12/24/2021   Pneumonia Vaccine 16+ Years old  Completed   Hepatitis C Screening  Completed   HPV VACCINES  Aged Out   INFLUENZA VACCINE  Discontinued    Health Maintenance  Health Maintenance Due  Topic Date Due   TETANUS/TDAP  Never done   Zoster Vaccines- Shingrix (1 of 2) Never done   COVID-19 Vaccine (3 - Booster for Moderna series) 03/20/2020   FOOT EXAM  05/09/2021   URINE MICROALBUMIN  05/09/2021   OPHTHALMOLOGY EXAM  07/11/2021    Colorectal cancer screening: decline  Lung Cancer Screening: (Low Dose CT Chest recommended if Age 3-80 years, 30 pack-year currently smoking OR have quit w/in 15years.) does qualify.   Lung Cancer Screening Referral: ordered  Additional Screening:  Hepatitis C Screening: does qualify; Completed 05/02/2017  Vision Screening: Recommended annual ophthalmology exams for early detection of glaucoma and other disorders of the eye. Is the patient up to date with their annual eye exam?  No  Who is the provider or what is the name of the office in which the patient attends annual eye exams? none If pt is not established with a provider, would they like to be referred to a provider to establish care? No .   Dental Screening: Recommended annual dental exams for proper oral hygiene  Community Resource Referral / Chronic Care Management: CRR required this visit?  No   CCM required this visit?  No      Plan:     I have personally reviewed and noted the following in the patient's chart:   Medical and social history Use of  alcohol, tobacco or illicit drugs  Current medications and supplements including opioid prescriptions. Patient is not currently taking opioid prescriptions. Functional ability and status Nutritional status Physical activity Advanced directives List of other physicians Hospitalizations, surgeries, and ER visits in previous 12 months Vitals Screenings to include cognitive, depression, and falls Referrals and appointments  In addition, I have reviewed and discussed with patient certain preventive protocols, quality metrics, and best practice recommendations. A written personalized care plan for preventive services as well as general preventive health recommendations were provided to patient.     Kellie Simmering, LPN   [id number]   Nurse Notes: States he has not been taking fosamax, jardiance or flomax.

## 2021-09-28 NOTE — Addendum Note (Signed)
Addended by: Barb Merino on: 09/28/2021 09:38 AM   Modules accepted: Orders

## 2021-10-12 ENCOUNTER — Telehealth: Payer: Self-pay | Admitting: Family Medicine

## 2021-10-12 NOTE — Telephone Encounter (Signed)
Patient called in asking if something else is going to be sent in for his urine flow. He says that he is completely out of the medication that he was prescribed. Asked patient what the name of the medication was and he says that he does not remember but it is a capsule.  Patient can be reached at [phone].  Please advise.

## 2021-10-12 NOTE — Telephone Encounter (Signed)
Please advise 

## 2021-10-13 MED ORDER — TAMSULOSIN HCL 0.4 MG PO CAPS: 0.4000 mg | ORAL_CAPSULE | Freq: Every day | ORAL | 5 refills | Status: DC

## 2021-10-13 NOTE — Telephone Encounter (Signed)
Rx sent in. Spoke with the patient and he is aware.  °

## 2021-10-25 DIAGNOSIS — R0603 Acute respiratory distress: Secondary | ICD-10-CM | POA: Diagnosis not present

## 2021-10-25 DIAGNOSIS — E872 Acidosis, unspecified: Secondary | ICD-10-CM | POA: Diagnosis not present

## 2021-10-25 DIAGNOSIS — R9431 Abnormal electrocardiogram [ECG] [EKG]: Secondary | ICD-10-CM | POA: Diagnosis not present

## 2021-10-25 DIAGNOSIS — Z79899 Other long term (current) drug therapy: Secondary | ICD-10-CM | POA: Diagnosis not present

## 2021-10-25 DIAGNOSIS — F17210 Nicotine dependence, cigarettes, uncomplicated: Secondary | ICD-10-CM | POA: Diagnosis not present

## 2021-10-25 DIAGNOSIS — J9600 Acute respiratory failure, unspecified whether with hypoxia or hypercapnia: Secondary | ICD-10-CM | POA: Diagnosis not present

## 2021-10-25 DIAGNOSIS — J810 Acute pulmonary edema: Secondary | ICD-10-CM | POA: Diagnosis not present

## 2021-10-25 DIAGNOSIS — R778 Other specified abnormalities of plasma proteins: Secondary | ICD-10-CM | POA: Diagnosis not present

## 2021-10-25 DIAGNOSIS — E785 Hyperlipidemia, unspecified: Secondary | ICD-10-CM | POA: Diagnosis not present

## 2021-10-25 DIAGNOSIS — E119 Type 2 diabetes mellitus without complications: Secondary | ICD-10-CM | POA: Diagnosis not present

## 2021-10-25 DIAGNOSIS — Z7984 Long term (current) use of oral hypoglycemic drugs: Secondary | ICD-10-CM | POA: Diagnosis not present

## 2021-10-25 DIAGNOSIS — I700 Atherosclerosis of aorta: Secondary | ICD-10-CM | POA: Diagnosis not present

## 2021-10-25 DIAGNOSIS — U071 COVID-19: Secondary | ICD-10-CM | POA: Diagnosis not present

## 2021-10-26 DIAGNOSIS — I5020 Unspecified systolic (congestive) heart failure: Secondary | ICD-10-CM | POA: Diagnosis not present

## 2021-10-26 DIAGNOSIS — B3322 Viral myocarditis: Secondary | ICD-10-CM | POA: Diagnosis not present

## 2021-10-26 DIAGNOSIS — J810 Acute pulmonary edema: Secondary | ICD-10-CM | POA: Diagnosis not present

## 2021-10-26 DIAGNOSIS — J1282 Pneumonia due to coronavirus disease 2019: Secondary | ICD-10-CM | POA: Diagnosis not present

## 2021-10-26 DIAGNOSIS — R339 Retention of urine, unspecified: Secondary | ICD-10-CM | POA: Diagnosis not present

## 2021-10-26 DIAGNOSIS — I214 Non-ST elevation (NSTEMI) myocardial infarction: Secondary | ICD-10-CM | POA: Diagnosis not present

## 2021-10-26 DIAGNOSIS — I429 Cardiomyopathy, unspecified: Secondary | ICD-10-CM | POA: Diagnosis not present

## 2021-10-26 DIAGNOSIS — J441 Chronic obstructive pulmonary disease with (acute) exacerbation: Secondary | ICD-10-CM | POA: Diagnosis not present

## 2021-10-26 DIAGNOSIS — Z7984 Long term (current) use of oral hypoglycemic drugs: Secondary | ICD-10-CM | POA: Diagnosis not present

## 2021-10-26 DIAGNOSIS — R338 Other retention of urine: Secondary | ICD-10-CM | POA: Diagnosis not present

## 2021-10-26 DIAGNOSIS — R778 Other specified abnormalities of plasma proteins: Secondary | ICD-10-CM | POA: Diagnosis not present

## 2021-10-26 DIAGNOSIS — I5021 Acute systolic (congestive) heart failure: Secondary | ICD-10-CM | POA: Diagnosis not present

## 2021-10-26 DIAGNOSIS — R609 Edema, unspecified: Secondary | ICD-10-CM | POA: Diagnosis not present

## 2021-10-26 DIAGNOSIS — U071 COVID-19: Secondary | ICD-10-CM | POA: Diagnosis not present

## 2021-10-26 DIAGNOSIS — Z66 Do not resuscitate: Secondary | ICD-10-CM | POA: Diagnosis not present

## 2021-10-26 DIAGNOSIS — N179 Acute kidney failure, unspecified: Secondary | ICD-10-CM | POA: Diagnosis not present

## 2021-10-26 DIAGNOSIS — Z79899 Other long term (current) drug therapy: Secondary | ICD-10-CM | POA: Diagnosis not present

## 2021-10-26 DIAGNOSIS — I509 Heart failure, unspecified: Secondary | ICD-10-CM | POA: Diagnosis not present

## 2021-10-26 DIAGNOSIS — F17210 Nicotine dependence, cigarettes, uncomplicated: Secondary | ICD-10-CM | POA: Diagnosis not present

## 2021-10-26 DIAGNOSIS — Z720 Tobacco use: Secondary | ICD-10-CM | POA: Diagnosis not present

## 2021-10-26 DIAGNOSIS — I2510 Atherosclerotic heart disease of native coronary artery without angina pectoris: Secondary | ICD-10-CM | POA: Diagnosis not present

## 2021-10-26 DIAGNOSIS — Z743 Need for continuous supervision: Secondary | ICD-10-CM | POA: Diagnosis not present

## 2021-10-26 DIAGNOSIS — J9601 Acute respiratory failure with hypoxia: Secondary | ICD-10-CM | POA: Diagnosis not present

## 2021-10-26 DIAGNOSIS — J80 Acute respiratory distress syndrome: Secondary | ICD-10-CM | POA: Diagnosis not present

## 2021-10-26 DIAGNOSIS — E1165 Type 2 diabetes mellitus with hyperglycemia: Secondary | ICD-10-CM | POA: Diagnosis not present

## 2021-10-26 DIAGNOSIS — E875 Hyperkalemia: Secondary | ICD-10-CM | POA: Diagnosis not present

## 2021-10-26 DIAGNOSIS — J440 Chronic obstructive pulmonary disease with acute lower respiratory infection: Secondary | ICD-10-CM | POA: Diagnosis not present

## 2021-10-26 DIAGNOSIS — R0602 Shortness of breath: Secondary | ICD-10-CM | POA: Diagnosis not present

## 2021-10-26 DIAGNOSIS — T380X5A Adverse effect of glucocorticoids and synthetic analogues, initial encounter: Secondary | ICD-10-CM | POA: Diagnosis not present

## 2021-10-26 DIAGNOSIS — E872 Acidosis, unspecified: Secondary | ICD-10-CM | POA: Diagnosis not present

## 2021-10-27 DIAGNOSIS — I214 Non-ST elevation (NSTEMI) myocardial infarction: Secondary | ICD-10-CM | POA: Diagnosis not present

## 2021-10-27 DIAGNOSIS — I509 Heart failure, unspecified: Secondary | ICD-10-CM | POA: Diagnosis not present

## 2021-10-27 DIAGNOSIS — U071 COVID-19: Secondary | ICD-10-CM | POA: Diagnosis not present

## 2021-10-28 DIAGNOSIS — I214 Non-ST elevation (NSTEMI) myocardial infarction: Secondary | ICD-10-CM | POA: Diagnosis not present

## 2021-10-28 DIAGNOSIS — U071 COVID-19: Secondary | ICD-10-CM | POA: Diagnosis not present

## 2021-10-28 DIAGNOSIS — R0602 Shortness of breath: Secondary | ICD-10-CM | POA: Diagnosis not present

## 2021-10-28 DIAGNOSIS — I509 Heart failure, unspecified: Secondary | ICD-10-CM | POA: Diagnosis not present

## 2021-10-29 DIAGNOSIS — I509 Heart failure, unspecified: Secondary | ICD-10-CM | POA: Diagnosis not present

## 2021-10-29 DIAGNOSIS — R778 Other specified abnormalities of plasma proteins: Secondary | ICD-10-CM | POA: Diagnosis not present

## 2021-10-29 DIAGNOSIS — I214 Non-ST elevation (NSTEMI) myocardial infarction: Secondary | ICD-10-CM | POA: Diagnosis not present

## 2021-10-29 DIAGNOSIS — U071 COVID-19: Secondary | ICD-10-CM | POA: Diagnosis not present

## 2021-10-29 DIAGNOSIS — I429 Cardiomyopathy, unspecified: Secondary | ICD-10-CM | POA: Diagnosis not present

## 2021-10-30 DIAGNOSIS — U071 COVID-19: Secondary | ICD-10-CM | POA: Diagnosis not present

## 2021-10-30 DIAGNOSIS — I509 Heart failure, unspecified: Secondary | ICD-10-CM | POA: Diagnosis not present

## 2021-10-30 DIAGNOSIS — I214 Non-ST elevation (NSTEMI) myocardial infarction: Secondary | ICD-10-CM | POA: Diagnosis not present

## 2021-11-02 ENCOUNTER — Ambulatory Visit: Payer: Medicare Other | Admitting: Family Medicine

## 2021-11-05 DIAGNOSIS — Z01818 Encounter for other preprocedural examination: Secondary | ICD-10-CM | POA: Diagnosis not present

## 2021-11-05 DIAGNOSIS — I214 Non-ST elevation (NSTEMI) myocardial infarction: Secondary | ICD-10-CM | POA: Diagnosis not present

## 2021-11-05 DIAGNOSIS — I700 Atherosclerosis of aorta: Secondary | ICD-10-CM | POA: Diagnosis not present

## 2021-11-05 DIAGNOSIS — R0602 Shortness of breath: Secondary | ICD-10-CM | POA: Diagnosis not present

## 2021-11-10 ENCOUNTER — Inpatient Hospital Stay: Payer: Medicare Other | Admitting: Family Medicine

## 2021-11-11 DIAGNOSIS — E785 Hyperlipidemia, unspecified: Secondary | ICD-10-CM | POA: Diagnosis not present

## 2021-11-11 DIAGNOSIS — I10 Essential (primary) hypertension: Secondary | ICD-10-CM | POA: Diagnosis not present

## 2021-11-11 DIAGNOSIS — E118 Type 2 diabetes mellitus with unspecified complications: Secondary | ICD-10-CM | POA: Diagnosis not present

## 2021-11-11 DIAGNOSIS — I25110 Atherosclerotic heart disease of native coronary artery with unstable angina pectoris: Secondary | ICD-10-CM | POA: Diagnosis not present

## 2021-11-17 DIAGNOSIS — J439 Emphysema, unspecified: Secondary | ICD-10-CM | POA: Diagnosis not present

## 2021-11-17 DIAGNOSIS — E1165 Type 2 diabetes mellitus with hyperglycemia: Secondary | ICD-10-CM | POA: Diagnosis not present

## 2021-11-17 DIAGNOSIS — Z981 Arthrodesis status: Secondary | ICD-10-CM | POA: Diagnosis not present

## 2021-11-17 DIAGNOSIS — R079 Chest pain, unspecified: Secondary | ICD-10-CM | POA: Diagnosis not present

## 2021-11-17 DIAGNOSIS — I5022 Chronic systolic (congestive) heart failure: Secondary | ICD-10-CM | POA: Diagnosis not present

## 2021-11-17 DIAGNOSIS — Z452 Encounter for adjustment and management of vascular access device: Secondary | ICD-10-CM | POA: Diagnosis not present

## 2021-11-17 DIAGNOSIS — I25118 Atherosclerotic heart disease of native coronary artery with other forms of angina pectoris: Secondary | ICD-10-CM | POA: Diagnosis not present

## 2021-11-17 DIAGNOSIS — J9811 Atelectasis: Secondary | ICD-10-CM | POA: Diagnosis not present

## 2021-11-17 DIAGNOSIS — D62 Acute posthemorrhagic anemia: Secondary | ICD-10-CM | POA: Diagnosis not present

## 2021-11-17 DIAGNOSIS — T797XXA Traumatic subcutaneous emphysema, initial encounter: Secondary | ICD-10-CM | POA: Diagnosis not present

## 2021-11-17 DIAGNOSIS — Z8616 Personal history of COVID-19: Secondary | ICD-10-CM | POA: Diagnosis not present

## 2021-11-17 DIAGNOSIS — I517 Cardiomegaly: Secondary | ICD-10-CM | POA: Diagnosis not present

## 2021-11-17 DIAGNOSIS — Z7984 Long term (current) use of oral hypoglycemic drugs: Secondary | ICD-10-CM | POA: Diagnosis not present

## 2021-11-17 DIAGNOSIS — I447 Left bundle-branch block, unspecified: Secondary | ICD-10-CM | POA: Diagnosis not present

## 2021-11-17 DIAGNOSIS — I4510 Unspecified right bundle-branch block: Secondary | ICD-10-CM | POA: Diagnosis not present

## 2021-11-17 DIAGNOSIS — J939 Pneumothorax, unspecified: Secondary | ICD-10-CM | POA: Diagnosis not present

## 2021-11-17 DIAGNOSIS — Z4682 Encounter for fitting and adjustment of non-vascular catheter: Secondary | ICD-10-CM | POA: Diagnosis not present

## 2021-11-17 DIAGNOSIS — N182 Chronic kidney disease, stage 2 (mild): Secondary | ICD-10-CM | POA: Diagnosis not present

## 2021-11-17 DIAGNOSIS — I252 Old myocardial infarction: Secondary | ICD-10-CM | POA: Diagnosis not present

## 2021-11-17 DIAGNOSIS — Z48812 Encounter for surgical aftercare following surgery on the circulatory system: Secondary | ICD-10-CM | POA: Diagnosis not present

## 2021-11-17 DIAGNOSIS — D696 Thrombocytopenia, unspecified: Secondary | ICD-10-CM | POA: Diagnosis not present

## 2021-11-17 DIAGNOSIS — I739 Peripheral vascular disease, unspecified: Secondary | ICD-10-CM | POA: Diagnosis not present

## 2021-11-17 DIAGNOSIS — Z951 Presence of aortocoronary bypass graft: Secondary | ICD-10-CM | POA: Diagnosis not present

## 2021-11-17 DIAGNOSIS — R9431 Abnormal electrocardiogram [ECG] [EKG]: Secondary | ICD-10-CM | POA: Diagnosis not present

## 2021-11-17 DIAGNOSIS — E785 Hyperlipidemia, unspecified: Secondary | ICD-10-CM | POA: Diagnosis not present

## 2021-11-17 DIAGNOSIS — E1122 Type 2 diabetes mellitus with diabetic chronic kidney disease: Secondary | ICD-10-CM | POA: Diagnosis not present

## 2021-11-17 DIAGNOSIS — I214 Non-ST elevation (NSTEMI) myocardial infarction: Secondary | ICD-10-CM | POA: Diagnosis not present

## 2021-11-17 DIAGNOSIS — F17200 Nicotine dependence, unspecified, uncomplicated: Secondary | ICD-10-CM | POA: Diagnosis not present

## 2021-11-17 DIAGNOSIS — E1151 Type 2 diabetes mellitus with diabetic peripheral angiopathy without gangrene: Secondary | ICD-10-CM | POA: Diagnosis not present

## 2021-11-17 DIAGNOSIS — Z79899 Other long term (current) drug therapy: Secondary | ICD-10-CM | POA: Diagnosis not present

## 2021-11-17 DIAGNOSIS — I9589 Other hypotension: Secondary | ICD-10-CM | POA: Diagnosis not present

## 2021-11-18 DIAGNOSIS — T797XXA Traumatic subcutaneous emphysema, initial encounter: Secondary | ICD-10-CM | POA: Diagnosis not present

## 2021-11-18 DIAGNOSIS — Z951 Presence of aortocoronary bypass graft: Secondary | ICD-10-CM | POA: Diagnosis not present

## 2021-11-18 DIAGNOSIS — Z452 Encounter for adjustment and management of vascular access device: Secondary | ICD-10-CM | POA: Diagnosis not present

## 2021-11-18 DIAGNOSIS — Z48812 Encounter for surgical aftercare following surgery on the circulatory system: Secondary | ICD-10-CM | POA: Diagnosis not present

## 2021-11-18 DIAGNOSIS — J939 Pneumothorax, unspecified: Secondary | ICD-10-CM | POA: Diagnosis not present

## 2021-11-18 DIAGNOSIS — J9811 Atelectasis: Secondary | ICD-10-CM | POA: Diagnosis not present

## 2021-11-18 DIAGNOSIS — Z4682 Encounter for fitting and adjustment of non-vascular catheter: Secondary | ICD-10-CM | POA: Diagnosis not present

## 2021-11-19 DIAGNOSIS — Z48812 Encounter for surgical aftercare following surgery on the circulatory system: Secondary | ICD-10-CM | POA: Diagnosis not present

## 2021-11-19 DIAGNOSIS — J939 Pneumothorax, unspecified: Secondary | ICD-10-CM | POA: Diagnosis not present

## 2021-11-19 DIAGNOSIS — Z4682 Encounter for fitting and adjustment of non-vascular catheter: Secondary | ICD-10-CM | POA: Diagnosis not present

## 2021-11-26 DIAGNOSIS — Z7984 Long term (current) use of oral hypoglycemic drugs: Secondary | ICD-10-CM | POA: Diagnosis not present

## 2021-11-26 DIAGNOSIS — I130 Hypertensive heart and chronic kidney disease with heart failure and stage 1 through stage 4 chronic kidney disease, or unspecified chronic kidney disease: Secondary | ICD-10-CM | POA: Diagnosis not present

## 2021-11-26 DIAGNOSIS — I214 Non-ST elevation (NSTEMI) myocardial infarction: Secondary | ICD-10-CM | POA: Diagnosis not present

## 2021-11-26 DIAGNOSIS — N182 Chronic kidney disease, stage 2 (mild): Secondary | ICD-10-CM | POA: Diagnosis not present

## 2021-11-26 DIAGNOSIS — E1165 Type 2 diabetes mellitus with hyperglycemia: Secondary | ICD-10-CM | POA: Diagnosis not present

## 2021-11-26 DIAGNOSIS — Z48812 Encounter for surgical aftercare following surgery on the circulatory system: Secondary | ICD-10-CM | POA: Diagnosis not present

## 2021-11-26 DIAGNOSIS — D62 Acute posthemorrhagic anemia: Secondary | ICD-10-CM | POA: Diagnosis not present

## 2021-11-26 DIAGNOSIS — Z7982 Long term (current) use of aspirin: Secondary | ICD-10-CM | POA: Diagnosis not present

## 2021-11-26 DIAGNOSIS — F17210 Nicotine dependence, cigarettes, uncomplicated: Secondary | ICD-10-CM | POA: Diagnosis not present

## 2021-11-26 DIAGNOSIS — D696 Thrombocytopenia, unspecified: Secondary | ICD-10-CM | POA: Diagnosis not present

## 2021-11-26 DIAGNOSIS — Z951 Presence of aortocoronary bypass graft: Secondary | ICD-10-CM | POA: Diagnosis not present

## 2021-11-26 DIAGNOSIS — I25118 Atherosclerotic heart disease of native coronary artery with other forms of angina pectoris: Secondary | ICD-10-CM | POA: Diagnosis not present

## 2021-11-26 DIAGNOSIS — E785 Hyperlipidemia, unspecified: Secondary | ICD-10-CM | POA: Diagnosis not present

## 2021-11-26 DIAGNOSIS — Z8616 Personal history of COVID-19: Secondary | ICD-10-CM | POA: Diagnosis not present

## 2021-11-26 DIAGNOSIS — I5022 Chronic systolic (congestive) heart failure: Secondary | ICD-10-CM | POA: Diagnosis not present

## 2021-11-26 DIAGNOSIS — J449 Chronic obstructive pulmonary disease, unspecified: Secondary | ICD-10-CM | POA: Diagnosis not present

## 2021-11-26 DIAGNOSIS — Z7902 Long term (current) use of antithrombotics/antiplatelets: Secondary | ICD-10-CM | POA: Diagnosis not present

## 2021-11-27 DIAGNOSIS — E1165 Type 2 diabetes mellitus with hyperglycemia: Secondary | ICD-10-CM | POA: Diagnosis not present

## 2021-11-27 DIAGNOSIS — F17210 Nicotine dependence, cigarettes, uncomplicated: Secondary | ICD-10-CM | POA: Diagnosis not present

## 2021-11-27 DIAGNOSIS — Z8616 Personal history of COVID-19: Secondary | ICD-10-CM | POA: Diagnosis not present

## 2021-11-27 DIAGNOSIS — I214 Non-ST elevation (NSTEMI) myocardial infarction: Secondary | ICD-10-CM | POA: Diagnosis not present

## 2021-11-27 DIAGNOSIS — J449 Chronic obstructive pulmonary disease, unspecified: Secondary | ICD-10-CM | POA: Diagnosis not present

## 2021-11-27 DIAGNOSIS — Z7984 Long term (current) use of oral hypoglycemic drugs: Secondary | ICD-10-CM | POA: Diagnosis not present

## 2021-11-27 DIAGNOSIS — D696 Thrombocytopenia, unspecified: Secondary | ICD-10-CM | POA: Diagnosis not present

## 2021-11-27 DIAGNOSIS — I130 Hypertensive heart and chronic kidney disease with heart failure and stage 1 through stage 4 chronic kidney disease, or unspecified chronic kidney disease: Secondary | ICD-10-CM | POA: Diagnosis not present

## 2021-11-27 DIAGNOSIS — Z7902 Long term (current) use of antithrombotics/antiplatelets: Secondary | ICD-10-CM | POA: Diagnosis not present

## 2021-11-27 DIAGNOSIS — Z48812 Encounter for surgical aftercare following surgery on the circulatory system: Secondary | ICD-10-CM | POA: Diagnosis not present

## 2021-11-27 DIAGNOSIS — E785 Hyperlipidemia, unspecified: Secondary | ICD-10-CM | POA: Diagnosis not present

## 2021-11-27 DIAGNOSIS — Z7982 Long term (current) use of aspirin: Secondary | ICD-10-CM | POA: Diagnosis not present

## 2021-11-27 DIAGNOSIS — D62 Acute posthemorrhagic anemia: Secondary | ICD-10-CM | POA: Diagnosis not present

## 2021-11-27 DIAGNOSIS — Z951 Presence of aortocoronary bypass graft: Secondary | ICD-10-CM | POA: Diagnosis not present

## 2021-11-27 DIAGNOSIS — N182 Chronic kidney disease, stage 2 (mild): Secondary | ICD-10-CM | POA: Diagnosis not present

## 2021-11-27 DIAGNOSIS — I5022 Chronic systolic (congestive) heart failure: Secondary | ICD-10-CM | POA: Diagnosis not present

## 2021-11-27 DIAGNOSIS — I25118 Atherosclerotic heart disease of native coronary artery with other forms of angina pectoris: Secondary | ICD-10-CM | POA: Diagnosis not present

## 2021-11-30 DIAGNOSIS — Z981 Arthrodesis status: Secondary | ICD-10-CM | POA: Diagnosis not present

## 2021-11-30 DIAGNOSIS — Z4682 Encounter for fitting and adjustment of non-vascular catheter: Secondary | ICD-10-CM | POA: Diagnosis not present

## 2021-11-30 DIAGNOSIS — Z4889 Encounter for other specified surgical aftercare: Secondary | ICD-10-CM | POA: Diagnosis not present

## 2021-11-30 DIAGNOSIS — I700 Atherosclerosis of aorta: Secondary | ICD-10-CM | POA: Diagnosis not present

## 2021-11-30 DIAGNOSIS — T797XXA Traumatic subcutaneous emphysema, initial encounter: Secondary | ICD-10-CM | POA: Diagnosis not present

## 2021-11-30 DIAGNOSIS — Z9889 Other specified postprocedural states: Secondary | ICD-10-CM | POA: Diagnosis not present

## 2021-12-03 DIAGNOSIS — Z951 Presence of aortocoronary bypass graft: Secondary | ICD-10-CM | POA: Diagnosis not present

## 2021-12-03 DIAGNOSIS — E1165 Type 2 diabetes mellitus with hyperglycemia: Secondary | ICD-10-CM | POA: Diagnosis not present

## 2021-12-03 DIAGNOSIS — Z7982 Long term (current) use of aspirin: Secondary | ICD-10-CM | POA: Diagnosis not present

## 2021-12-03 DIAGNOSIS — Z48812 Encounter for surgical aftercare following surgery on the circulatory system: Secondary | ICD-10-CM | POA: Diagnosis not present

## 2021-12-03 DIAGNOSIS — Z7984 Long term (current) use of oral hypoglycemic drugs: Secondary | ICD-10-CM | POA: Diagnosis not present

## 2021-12-03 DIAGNOSIS — D62 Acute posthemorrhagic anemia: Secondary | ICD-10-CM | POA: Diagnosis not present

## 2021-12-03 DIAGNOSIS — I214 Non-ST elevation (NSTEMI) myocardial infarction: Secondary | ICD-10-CM | POA: Diagnosis not present

## 2021-12-03 DIAGNOSIS — I5022 Chronic systolic (congestive) heart failure: Secondary | ICD-10-CM | POA: Diagnosis not present

## 2021-12-03 DIAGNOSIS — I130 Hypertensive heart and chronic kidney disease with heart failure and stage 1 through stage 4 chronic kidney disease, or unspecified chronic kidney disease: Secondary | ICD-10-CM | POA: Diagnosis not present

## 2021-12-03 DIAGNOSIS — I25118 Atherosclerotic heart disease of native coronary artery with other forms of angina pectoris: Secondary | ICD-10-CM | POA: Diagnosis not present

## 2021-12-03 DIAGNOSIS — N182 Chronic kidney disease, stage 2 (mild): Secondary | ICD-10-CM | POA: Diagnosis not present

## 2021-12-03 DIAGNOSIS — D696 Thrombocytopenia, unspecified: Secondary | ICD-10-CM | POA: Diagnosis not present

## 2021-12-03 DIAGNOSIS — Z8616 Personal history of COVID-19: Secondary | ICD-10-CM | POA: Diagnosis not present

## 2021-12-03 DIAGNOSIS — Z7902 Long term (current) use of antithrombotics/antiplatelets: Secondary | ICD-10-CM | POA: Diagnosis not present

## 2021-12-03 DIAGNOSIS — F17210 Nicotine dependence, cigarettes, uncomplicated: Secondary | ICD-10-CM | POA: Diagnosis not present

## 2021-12-03 DIAGNOSIS — E785 Hyperlipidemia, unspecified: Secondary | ICD-10-CM | POA: Diagnosis not present

## 2021-12-03 DIAGNOSIS — J449 Chronic obstructive pulmonary disease, unspecified: Secondary | ICD-10-CM | POA: Diagnosis not present

## 2021-12-11 DIAGNOSIS — F17210 Nicotine dependence, cigarettes, uncomplicated: Secondary | ICD-10-CM | POA: Diagnosis not present

## 2021-12-11 DIAGNOSIS — Z7982 Long term (current) use of aspirin: Secondary | ICD-10-CM | POA: Diagnosis not present

## 2021-12-11 DIAGNOSIS — Z7984 Long term (current) use of oral hypoglycemic drugs: Secondary | ICD-10-CM | POA: Diagnosis not present

## 2021-12-11 DIAGNOSIS — I214 Non-ST elevation (NSTEMI) myocardial infarction: Secondary | ICD-10-CM | POA: Diagnosis not present

## 2021-12-11 DIAGNOSIS — I130 Hypertensive heart and chronic kidney disease with heart failure and stage 1 through stage 4 chronic kidney disease, or unspecified chronic kidney disease: Secondary | ICD-10-CM | POA: Diagnosis not present

## 2021-12-11 DIAGNOSIS — Z48812 Encounter for surgical aftercare following surgery on the circulatory system: Secondary | ICD-10-CM | POA: Diagnosis not present

## 2021-12-11 DIAGNOSIS — N182 Chronic kidney disease, stage 2 (mild): Secondary | ICD-10-CM | POA: Diagnosis not present

## 2021-12-11 DIAGNOSIS — Z8616 Personal history of COVID-19: Secondary | ICD-10-CM | POA: Diagnosis not present

## 2021-12-11 DIAGNOSIS — J449 Chronic obstructive pulmonary disease, unspecified: Secondary | ICD-10-CM | POA: Diagnosis not present

## 2021-12-11 DIAGNOSIS — D696 Thrombocytopenia, unspecified: Secondary | ICD-10-CM | POA: Diagnosis not present

## 2021-12-11 DIAGNOSIS — I25118 Atherosclerotic heart disease of native coronary artery with other forms of angina pectoris: Secondary | ICD-10-CM | POA: Diagnosis not present

## 2021-12-11 DIAGNOSIS — Z7902 Long term (current) use of antithrombotics/antiplatelets: Secondary | ICD-10-CM | POA: Diagnosis not present

## 2021-12-11 DIAGNOSIS — Z951 Presence of aortocoronary bypass graft: Secondary | ICD-10-CM | POA: Diagnosis not present

## 2021-12-11 DIAGNOSIS — E1165 Type 2 diabetes mellitus with hyperglycemia: Secondary | ICD-10-CM | POA: Diagnosis not present

## 2021-12-11 DIAGNOSIS — I5022 Chronic systolic (congestive) heart failure: Secondary | ICD-10-CM | POA: Diagnosis not present

## 2021-12-11 DIAGNOSIS — E785 Hyperlipidemia, unspecified: Secondary | ICD-10-CM | POA: Diagnosis not present

## 2021-12-11 DIAGNOSIS — D62 Acute posthemorrhagic anemia: Secondary | ICD-10-CM | POA: Diagnosis not present

## 2021-12-17 DIAGNOSIS — R0600 Dyspnea, unspecified: Secondary | ICD-10-CM | POA: Diagnosis not present

## 2021-12-17 DIAGNOSIS — E785 Hyperlipidemia, unspecified: Secondary | ICD-10-CM | POA: Diagnosis not present

## 2021-12-17 DIAGNOSIS — I2510 Atherosclerotic heart disease of native coronary artery without angina pectoris: Secondary | ICD-10-CM | POA: Diagnosis not present

## 2021-12-18 DIAGNOSIS — I5022 Chronic systolic (congestive) heart failure: Secondary | ICD-10-CM | POA: Diagnosis not present

## 2021-12-18 DIAGNOSIS — Z7982 Long term (current) use of aspirin: Secondary | ICD-10-CM | POA: Diagnosis not present

## 2021-12-18 DIAGNOSIS — Z8616 Personal history of COVID-19: Secondary | ICD-10-CM | POA: Diagnosis not present

## 2021-12-18 DIAGNOSIS — Z48812 Encounter for surgical aftercare following surgery on the circulatory system: Secondary | ICD-10-CM | POA: Diagnosis not present

## 2021-12-18 DIAGNOSIS — Z951 Presence of aortocoronary bypass graft: Secondary | ICD-10-CM | POA: Diagnosis not present

## 2021-12-18 DIAGNOSIS — I130 Hypertensive heart and chronic kidney disease with heart failure and stage 1 through stage 4 chronic kidney disease, or unspecified chronic kidney disease: Secondary | ICD-10-CM | POA: Diagnosis not present

## 2021-12-18 DIAGNOSIS — J449 Chronic obstructive pulmonary disease, unspecified: Secondary | ICD-10-CM | POA: Diagnosis not present

## 2021-12-18 DIAGNOSIS — I25118 Atherosclerotic heart disease of native coronary artery with other forms of angina pectoris: Secondary | ICD-10-CM | POA: Diagnosis not present

## 2021-12-18 DIAGNOSIS — I214 Non-ST elevation (NSTEMI) myocardial infarction: Secondary | ICD-10-CM | POA: Diagnosis not present

## 2021-12-18 DIAGNOSIS — E1165 Type 2 diabetes mellitus with hyperglycemia: Secondary | ICD-10-CM | POA: Diagnosis not present

## 2021-12-18 DIAGNOSIS — D696 Thrombocytopenia, unspecified: Secondary | ICD-10-CM | POA: Diagnosis not present

## 2021-12-18 DIAGNOSIS — Z7902 Long term (current) use of antithrombotics/antiplatelets: Secondary | ICD-10-CM | POA: Diagnosis not present

## 2021-12-18 DIAGNOSIS — D62 Acute posthemorrhagic anemia: Secondary | ICD-10-CM | POA: Diagnosis not present

## 2021-12-18 DIAGNOSIS — F17210 Nicotine dependence, cigarettes, uncomplicated: Secondary | ICD-10-CM | POA: Diagnosis not present

## 2021-12-18 DIAGNOSIS — N182 Chronic kidney disease, stage 2 (mild): Secondary | ICD-10-CM | POA: Diagnosis not present

## 2021-12-18 DIAGNOSIS — Z7984 Long term (current) use of oral hypoglycemic drugs: Secondary | ICD-10-CM | POA: Diagnosis not present

## 2021-12-18 DIAGNOSIS — E785 Hyperlipidemia, unspecified: Secondary | ICD-10-CM | POA: Diagnosis not present

## 2021-12-25 ENCOUNTER — Encounter: Payer: Self-pay | Admitting: Family Medicine

## 2021-12-25 ENCOUNTER — Ambulatory Visit (INDEPENDENT_AMBULATORY_CARE_PROVIDER_SITE_OTHER): Payer: Medicare Other | Admitting: Family Medicine

## 2021-12-25 VITALS — BP 122/70 | HR 99 | Temp 98.4°F | Ht 70.5 in | Wt 158.2 lb

## 2021-12-25 DIAGNOSIS — R3912 Poor urinary stream: Secondary | ICD-10-CM | POA: Diagnosis not present

## 2021-12-25 DIAGNOSIS — I2510 Atherosclerotic heart disease of native coronary artery without angina pectoris: Secondary | ICD-10-CM | POA: Insufficient documentation

## 2021-12-25 DIAGNOSIS — N401 Enlarged prostate with lower urinary tract symptoms: Secondary | ICD-10-CM

## 2021-12-25 DIAGNOSIS — E113299 Type 2 diabetes mellitus with mild nonproliferative diabetic retinopathy without macular edema, unspecified eye: Secondary | ICD-10-CM

## 2021-12-25 DIAGNOSIS — E1151 Type 2 diabetes mellitus with diabetic peripheral angiopathy without gangrene: Secondary | ICD-10-CM | POA: Diagnosis not present

## 2021-12-25 DIAGNOSIS — E785 Hyperlipidemia, unspecified: Secondary | ICD-10-CM

## 2021-12-25 LAB — COMPREHENSIVE METABOLIC PANEL
ALT: 15 U/L (ref 0–53)
AST: 20 U/L (ref 0–37)
Albumin: 3.9 g/dL (ref 3.5–5.2)
Alkaline Phosphatase: 100 U/L (ref 39–117)
BUN: 9 mg/dL (ref 6–23)
CO2: 27 mEq/L (ref 19–32)
Calcium: 9.2 mg/dL (ref 8.4–10.5)
Chloride: 103 mEq/L (ref 96–112)
Creatinine, Ser: 1.19 mg/dL (ref 0.40–1.50)
GFR: 60.78 mL/min (ref >60.00)
Glucose, Bld: 274 mg/dL — ABNORMAL HIGH (ref 70–99)
Potassium: 4 mEq/L (ref 3.5–5.1)
Sodium: 138 mEq/L (ref 135–145)
Total Bilirubin: 0.5 mg/dL (ref 0.2–1.2)
Total Protein: 7 g/dL (ref 6.0–8.3)

## 2021-12-25 LAB — HEMOGLOBIN A1C: Hgb A1c MFr Bld: 8.3 % — ABNORMAL HIGH (ref 4.6–6.5)

## 2021-12-25 LAB — LIPID PANEL
Cholesterol: 162 mg/dL (ref 0–200)
HDL: 47.3 mg/dL (ref >39.00)
LDL Cholesterol: 78 mg/dL (ref 0–99)
NonHDL: 114.28
Total CHOL/HDL Ratio: 3
Triglycerides: 179 mg/dL — ABNORMAL HIGH (ref 0.0–149.0)
VLDL: 35.8 mg/dL (ref 0.0–40.0)

## 2021-12-25 NOTE — Progress Notes (Signed)
Established Patient Office Visit  Subjective:  Patient ID: Adam [name], male    DOB: 21-Aug-1951  Age: 71 y.o. MRN: [id number]  CC:  Chief Complaint  Patient presents with   Follow-up    HPI Adam [name] presents for follow-up regarding recent hospitalization for COVID and NSTEMI followed by subsequent coronary bypass surgery.  He has past medical history significant for ongoing nicotine use, type 2 diabetes, osteoporosis, hyperlipidemia, peripheral vascular disease.  He apparently developed some dyspnea particularly noted around January 1.  Went in for assessment and reportedly was COVID-positive and had pneumonia hospitalized January 2 through the 6.  At that time it was noted that he had NSTEMI.  Echocardiogram showed EF 20 to 25%.  Cardiac cath 10-29-2021 severe left main and three-vessel disease.  He underwent CABG times 4 on 11-17-21 uncomplicated.  Has seen cardiology in follow-up since hospitalization.  They have planned follow-up treadmill assessment and echo soon.  No cardiac rehab yet.  He did quit smoking initially after his bypass but is back to about 5 cigarettes/day currently.  Was placed on high-dose statin with Lipitor 40 mg daily.  Medications reviewed.  His current medications include aspirin, Lipitor 40 mg daily, Plavix 75 mg daily, glimepiride 2 mg daily, Jardiance 10 mg daily, metformin 500 mg 2 tablets twice daily, metoprolol extended release 25 mg daily, and tamsulosin 0.4 mg nightly.  Feels better overall.  Still has some fatigue but less dyspnea and no chest pains.  No dizziness.  Appetite is good.  His major complaint is slow urinary stream.  Occasional urinary leakage.  He feels like the tamsulosin helped some initially but has had some progressive symptoms since then.  He states he was having problems with slow stream and urination even prior to his Foley cath during recent hospitalization.  Denies any recent burning with urination.  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   PERIPHERAL VASCULAR DISEASE 08/27/2010   WEIGHT LOSS 08/27/2010    Past Surgical History:  Procedure Laterality Date   BACK SURGERY  2003   CHOLECYSTECTOMY  1990   KNEE SURGERY  1995   left    Family History  Problem Relation Age of Onset   Heart disease Father    Cancer Paternal Grandfather        lung    Social History   Socioeconomic History   Marital status: Single    Spouse name: Not on file   Number of children: 3   Years of education: 4 years college   Highest education level: Bachelor's degree (e.g., BA, AB, BS)  Occupational History   Not on file  Tobacco Use   Smoking status: Every Day    Packs/day: 1.00    Years: 50.00    Pack years: 50.00    Types: Cigarettes   Smokeless tobacco: Never  Vaping Use   Vaping Use: Never used  Substance and Sexual Activity   Alcohol use: Not Currently    Alcohol/week: 0.0 standard drinks    Comment: occasional   Drug use: No   Sexual activity: Not on file  Other Topics Concern   Not on file  Social History Narrative   Not on file   Social Determinants of Health   Financial Resource Strain: Low Risk    Difficulty of Paying Living Expenses: Not hard at all  Food Insecurity: No Food Insecurity  Worried About Programme researcher, broadcasting/film/video in the Last Year: Never true   [employer] of Food in the Last Year: Never true  Transportation Needs: No Transportation Needs   Lack of Transportation (Medical): No   Lack of Transportation (Non-Medical): No  Physical Activity: Inactive   Days of Exercise per Week: 0 days   Minutes of Exercise per Session: 0 min  Stress: No Stress Concern Present   Feeling of Stress : Not at all  Social Connections: Not on file  Intimate Partner Violence: Not on file    Outpatient Medications Prior to Visit  Medication Sig Dispense Refill   aspirin 325 MG tablet Take 650 mg by mouth daily.      atorvastatin (LIPITOR) 40 MG tablet Take 1 tablet (40 mg total) by mouth daily. 90 tablet 1   clopidogrel (PLAVIX) 75 MG tablet Take 75 mg by mouth daily.     glimepiride (AMARYL) 2 MG tablet Take 1 tablet (2 mg total) by mouth daily with breakfast. 90 tablet 3   glucose blood (ACCU-CHEK AVIVA PLUS) test strip Use to check blood sugars once daily. Dx: E11.65 100 each 2   JARDIANCE 10 MG TABS tablet TAKE 1 TABLET BY MOUTH ONCE DAILY BEFORE BREAKFAST 90 tablet 0   Lancet Devices (ACCU-CHEK SOFTCLIX) lancets Use to check blood sugars once daily. Dx: E11.65 100 each 2   metFORMIN (GLUCOPHAGE) 500 MG tablet Take 2 tablets (1,000 mg total) by mouth 2 (two) times daily. 360 tablet 1   metoprolol succinate (TOPROL-XL) 25 MG 24 hr tablet Take 25 mg by mouth daily.     ondansetron (ZOFRAN) 4 MG tablet      ONE TOUCH LANCETS MISC Use 3-4 times as week as directed     sildenafil (REVATIO) 20 MG tablet Take 2 to 5 tablets one hour prior to sexual activity. 50 tablet 3   tamsulosin (FLOMAX) 0.4 MG CAPS capsule Take 1 capsule (0.4 mg total) by mouth daily. 30 capsule 5   alendronate (FOSAMAX) 70 MG tablet Take 1 tablet (70 mg total) by mouth every 7 (seven) days. Take with a full glass of water on an empty stomach. 12 tablet 3   No facility-administered medications prior to visit.    Allergies  Allergen Reactions   Naproxen Sodium Swelling   Wt Readings from Last 3 Encounters:  12/25/21 158 lb 3 oz (71.8 kg)  09/28/21 165 lb (74.8 kg)  07/31/21 161 lb 6.4 oz (73.2 kg)     ROS Review of Systems  Constitutional:  Negative for chills and fever.  Respiratory:  Negative for cough.   Cardiovascular:  Negative for chest pain, palpitations and leg swelling.  Gastrointestinal:  Negative for abdominal pain.  Genitourinary:  Positive for difficulty urinating. Negative for flank pain and hematuria.     Objective:    Physical Exam Vitals reviewed.  Constitutional:      Appearance: Normal  appearance.  Cardiovascular:     Rate and Rhythm: Normal rate and regular rhythm.  Pulmonary:     Effort: Pulmonary effort is normal.     Breath sounds: Normal breath sounds.  Musculoskeletal:     Right lower leg: No edema.     Left lower leg: No edema.  Neurological:     Mental Status: He is alert.    BP 122/70    Pulse 99    Temp 98.4 F (36.9 C) (Oral)    Ht 5' 10.5" (1.791 m)    Wt 158 lb  3 oz (71.8 kg)    SpO2 99%    BMI 22.38 kg/m  Wt Readings from Last 3 Encounters:  12/25/21 158 lb 3 oz (71.8 kg)  09/28/21 165 lb (74.8 kg)  07/31/21 161 lb 6.4 oz (73.2 kg)     Health Maintenance Due  Topic Date Due   TETANUS/TDAP  Never done   COLONOSCOPY (Pts 45-45yrs Insurance coverage will need to be confirmed)  Never done   Zoster Vaccines- Shingrix (1 of 2) Never done   COVID-19 Vaccine (3 - Booster for Moderna series) 03/20/2020   FOOT EXAM  05/09/2021   URINE MICROALBUMIN  05/09/2021   OPHTHALMOLOGY EXAM  07/11/2021   HEMOGLOBIN A1C  12/24/2021    There are no preventive care reminders to display for this patient.  Lab Results  Component Value Date   TSH 0.93 08/23/2018   Lab Results  Component Value Date   WBC 12.0 (H) 08/27/2010   HGB 17.9 (H) 08/27/2010   HCT 51.2 08/27/2010   MCV 91.3 08/27/2010   PLT 209.0 08/27/2010   Lab Results  Component Value Date   NA 138 07/31/2021   K 4.7 07/31/2021   CO2 28 07/31/2021   GLUCOSE 300 (H) 07/31/2021   BUN 14 07/31/2021   CREATININE 1.27 07/31/2021   BILITOT 0.8 07/31/2021   ALKPHOS 64 07/31/2021   AST 14 07/31/2021   ALT 9 07/31/2021   PROT 6.9 07/31/2021   ALBUMIN 4.0 07/31/2021   CALCIUM 9.3 07/31/2021   GFR 56.38 (L) 07/31/2021   Lab Results  Component Value Date   CHOL 157 07/31/2021   Lab Results  Component Value Date   HDL 45.70 07/31/2021   Lab Results  Component Value Date   LDLCALC 90 07/31/2021   Lab Results  Component Value Date   TRIG 108.0 07/31/2021   Lab Results  Component  Value Date   CHOLHDL 3 07/31/2021   Lab Results  Component Value Date   HGBA1C 8.6 (H) 06/26/2021      Assessment & Plan:   #1 history of CAD with recent cardiac cath revealing severe left main and three-vessel disease status post CABG x4 late January.  Symptomatically improved.  No chest pain.  Less dyspnea.  -Continue close follow-up with cardiology -Recheck lipids today  #2 hyperlipidemia.  Recently started on atorvastatin 40 mg daily.  Check lipid and CMP today  #3 type 2 diabetes.  History of poor control.  Recheck A1c today.  Patient currently on Jardiance, low-dose glimepiride, and metformin.  Consider change to shorter acting sulfonylurea but will check labs first  #4 BPH with ongoing symptoms on tamsulosin.  Set up urology referral.   No orders of the defined types were placed in this encounter.   Follow-up: Return in about 3 months (around 03/27/2022).    Evelena Peat, MD

## 2021-12-28 DIAGNOSIS — Z981 Arthrodesis status: Secondary | ICD-10-CM | POA: Diagnosis not present

## 2021-12-28 DIAGNOSIS — Z9889 Other specified postprocedural states: Secondary | ICD-10-CM | POA: Diagnosis not present

## 2021-12-28 DIAGNOSIS — Z4889 Encounter for other specified surgical aftercare: Secondary | ICD-10-CM | POA: Diagnosis not present

## 2021-12-29 MED ORDER — SITAGLIPTIN PHOSPHATE 100 MG PO TABS: 100.0000 mg | ORAL_TABLET | Freq: Every day | ORAL | 0 refills | Status: DC

## 2021-12-29 NOTE — Addendum Note (Signed)
Addended by: Christy Sartorius on: 12/29/2021 12:06 PM ? ? Modules accepted: Orders ? ?

## 2022-01-11 DIAGNOSIS — I2510 Atherosclerotic heart disease of native coronary artery without angina pectoris: Secondary | ICD-10-CM | POA: Diagnosis not present

## 2022-01-18 DIAGNOSIS — Z7984 Long term (current) use of oral hypoglycemic drugs: Secondary | ICD-10-CM | POA: Diagnosis not present

## 2022-01-18 DIAGNOSIS — I252 Old myocardial infarction: Secondary | ICD-10-CM | POA: Diagnosis not present

## 2022-01-18 DIAGNOSIS — R0902 Hypoxemia: Secondary | ICD-10-CM | POA: Diagnosis not present

## 2022-01-18 DIAGNOSIS — I2510 Atherosclerotic heart disease of native coronary artery without angina pectoris: Secondary | ICD-10-CM | POA: Diagnosis not present

## 2022-01-18 DIAGNOSIS — Z743 Need for continuous supervision: Secondary | ICD-10-CM | POA: Diagnosis not present

## 2022-01-18 DIAGNOSIS — E872 Acidosis, unspecified: Secondary | ICD-10-CM | POA: Diagnosis not present

## 2022-01-18 DIAGNOSIS — D509 Iron deficiency anemia, unspecified: Secondary | ICD-10-CM | POA: Diagnosis not present

## 2022-01-18 DIAGNOSIS — Z951 Presence of aortocoronary bypass graft: Secondary | ICD-10-CM | POA: Diagnosis not present

## 2022-01-18 DIAGNOSIS — I517 Cardiomegaly: Secondary | ICD-10-CM | POA: Diagnosis not present

## 2022-01-18 DIAGNOSIS — J449 Chronic obstructive pulmonary disease, unspecified: Secondary | ICD-10-CM | POA: Diagnosis not present

## 2022-01-18 DIAGNOSIS — Z7982 Long term (current) use of aspirin: Secondary | ICD-10-CM | POA: Diagnosis not present

## 2022-01-18 DIAGNOSIS — Z87891 Personal history of nicotine dependence: Secondary | ICD-10-CM | POA: Diagnosis not present

## 2022-01-18 DIAGNOSIS — E785 Hyperlipidemia, unspecified: Secondary | ICD-10-CM | POA: Diagnosis not present

## 2022-01-18 DIAGNOSIS — J9601 Acute respiratory failure with hypoxia: Secondary | ICD-10-CM | POA: Diagnosis not present

## 2022-01-18 DIAGNOSIS — E119 Type 2 diabetes mellitus without complications: Secondary | ICD-10-CM | POA: Diagnosis not present

## 2022-01-18 DIAGNOSIS — I509 Heart failure, unspecified: Secondary | ICD-10-CM | POA: Diagnosis not present

## 2022-01-18 DIAGNOSIS — Z713 Dietary counseling and surveillance: Secondary | ICD-10-CM | POA: Diagnosis not present

## 2022-01-18 DIAGNOSIS — I10 Essential (primary) hypertension: Secondary | ICD-10-CM | POA: Diagnosis not present

## 2022-01-18 DIAGNOSIS — I447 Left bundle-branch block, unspecified: Secondary | ICD-10-CM | POA: Diagnosis not present

## 2022-01-18 DIAGNOSIS — I5023 Acute on chronic systolic (congestive) heart failure: Secondary | ICD-10-CM | POA: Diagnosis not present

## 2022-01-18 DIAGNOSIS — R079 Chest pain, unspecified: Secondary | ICD-10-CM | POA: Diagnosis not present

## 2022-01-18 DIAGNOSIS — R739 Hyperglycemia, unspecified: Secondary | ICD-10-CM | POA: Diagnosis not present

## 2022-01-18 DIAGNOSIS — R0789 Other chest pain: Secondary | ICD-10-CM | POA: Diagnosis not present

## 2022-01-18 DIAGNOSIS — Z720 Tobacco use: Secondary | ICD-10-CM | POA: Diagnosis not present

## 2022-01-18 DIAGNOSIS — Z79899 Other long term (current) drug therapy: Secondary | ICD-10-CM | POA: Diagnosis not present

## 2022-01-18 DIAGNOSIS — E1165 Type 2 diabetes mellitus with hyperglycemia: Secondary | ICD-10-CM | POA: Diagnosis not present

## 2022-01-18 DIAGNOSIS — R0602 Shortness of breath: Secondary | ICD-10-CM | POA: Diagnosis not present

## 2022-01-18 DIAGNOSIS — I110 Hypertensive heart disease with heart failure: Secondary | ICD-10-CM | POA: Diagnosis not present

## 2022-01-18 DIAGNOSIS — F17210 Nicotine dependence, cigarettes, uncomplicated: Secondary | ICD-10-CM | POA: Diagnosis not present

## 2022-01-19 DIAGNOSIS — J449 Chronic obstructive pulmonary disease, unspecified: Secondary | ICD-10-CM | POA: Diagnosis not present

## 2022-01-19 DIAGNOSIS — E785 Hyperlipidemia, unspecified: Secondary | ICD-10-CM | POA: Diagnosis not present

## 2022-01-19 DIAGNOSIS — J9601 Acute respiratory failure with hypoxia: Secondary | ICD-10-CM | POA: Diagnosis not present

## 2022-01-19 DIAGNOSIS — Z720 Tobacco use: Secondary | ICD-10-CM | POA: Diagnosis not present

## 2022-01-19 DIAGNOSIS — I2510 Atherosclerotic heart disease of native coronary artery without angina pectoris: Secondary | ICD-10-CM | POA: Diagnosis not present

## 2022-01-19 DIAGNOSIS — E872 Acidosis, unspecified: Secondary | ICD-10-CM | POA: Diagnosis not present

## 2022-01-19 DIAGNOSIS — D509 Iron deficiency anemia, unspecified: Secondary | ICD-10-CM | POA: Diagnosis not present

## 2022-01-19 DIAGNOSIS — I5023 Acute on chronic systolic (congestive) heart failure: Secondary | ICD-10-CM | POA: Diagnosis not present

## 2022-01-19 DIAGNOSIS — E1165 Type 2 diabetes mellitus with hyperglycemia: Secondary | ICD-10-CM | POA: Diagnosis not present

## 2022-01-20 DIAGNOSIS — E1165 Type 2 diabetes mellitus with hyperglycemia: Secondary | ICD-10-CM | POA: Diagnosis not present

## 2022-01-20 DIAGNOSIS — I5023 Acute on chronic systolic (congestive) heart failure: Secondary | ICD-10-CM | POA: Diagnosis not present

## 2022-01-20 DIAGNOSIS — I10 Essential (primary) hypertension: Secondary | ICD-10-CM | POA: Diagnosis not present

## 2022-01-20 DIAGNOSIS — J9601 Acute respiratory failure with hypoxia: Secondary | ICD-10-CM | POA: Diagnosis not present

## 2022-02-08 DIAGNOSIS — I5023 Acute on chronic systolic (congestive) heart failure: Secondary | ICD-10-CM | POA: Diagnosis not present

## 2022-02-09 ENCOUNTER — Inpatient Hospital Stay: Payer: Medicare Other | Admitting: Family Medicine

## 2022-02-10 DIAGNOSIS — I509 Heart failure, unspecified: Secondary | ICD-10-CM | POA: Diagnosis not present

## 2022-02-10 DIAGNOSIS — I2510 Atherosclerotic heart disease of native coronary artery without angina pectoris: Secondary | ICD-10-CM | POA: Diagnosis not present

## 2022-02-10 DIAGNOSIS — I429 Cardiomyopathy, unspecified: Secondary | ICD-10-CM | POA: Diagnosis not present

## 2022-02-10 DIAGNOSIS — E785 Hyperlipidemia, unspecified: Secondary | ICD-10-CM | POA: Diagnosis not present

## 2022-02-15 DIAGNOSIS — Z951 Presence of aortocoronary bypass graft: Secondary | ICD-10-CM | POA: Diagnosis not present

## 2022-02-15 DIAGNOSIS — I25118 Atherosclerotic heart disease of native coronary artery with other forms of angina pectoris: Secondary | ICD-10-CM | POA: Diagnosis not present

## 2022-02-24 ENCOUNTER — Other Ambulatory Visit: Payer: Self-pay | Admitting: Family Medicine

## 2022-02-24 DIAGNOSIS — E113299 Type 2 diabetes mellitus with mild nonproliferative diabetic retinopathy without macular edema, unspecified eye: Secondary | ICD-10-CM

## 2022-02-24 DIAGNOSIS — Z951 Presence of aortocoronary bypass graft: Secondary | ICD-10-CM | POA: Diagnosis not present

## 2022-03-15 ENCOUNTER — Other Ambulatory Visit: Payer: Self-pay

## 2022-03-15 DIAGNOSIS — E113299 Type 2 diabetes mellitus with mild nonproliferative diabetic retinopathy without macular edema, unspecified eye: Secondary | ICD-10-CM

## 2022-03-15 MED ORDER — SITAGLIPTIN PHOSPHATE 100 MG PO TABS: 100.0000 mg | ORAL_TABLET | Freq: Every day | ORAL | 0 refills | Status: DC

## 2022-03-15 MED ORDER — TAMSULOSIN HCL 0.4 MG PO CAPS: 0.4000 mg | ORAL_CAPSULE | Freq: Every day | ORAL | 5 refills | Status: DC

## 2022-03-15 MED ORDER — METFORMIN HCL 500 MG PO TABS: 1000.0000 mg | ORAL_TABLET | Freq: Two times a day (BID) | ORAL | 0 refills | Status: DC

## 2022-03-15 MED ORDER — GLIMEPIRIDE 2 MG PO TABS: 2.0000 mg | ORAL_TABLET | Freq: Every day | ORAL | 3 refills | Status: DC

## 2022-03-25 ENCOUNTER — Other Ambulatory Visit: Payer: Self-pay

## 2022-03-25 DIAGNOSIS — E113299 Type 2 diabetes mellitus with mild nonproliferative diabetic retinopathy without macular edema, unspecified eye: Secondary | ICD-10-CM

## 2022-03-25 MED ORDER — METFORMIN HCL 500 MG PO TABS: 1000.0000 mg | ORAL_TABLET | Freq: Two times a day (BID) | ORAL | 1 refills | Status: DC

## 2022-03-25 MED ORDER — EMPAGLIFLOZIN 10 MG PO TABS: 10.0000 mg | ORAL_TABLET | Freq: Every day | ORAL | 1 refills | Status: DC

## 2022-04-23 ENCOUNTER — Ambulatory Visit (INDEPENDENT_AMBULATORY_CARE_PROVIDER_SITE_OTHER): Payer: Medicare Other | Admitting: Family Medicine

## 2022-04-23 ENCOUNTER — Encounter: Payer: Self-pay | Admitting: Family Medicine

## 2022-04-23 VITALS — BP 110/60 | HR 80 | Temp 97.8°F | Ht 70.5 in | Wt 163.5 lb

## 2022-04-23 DIAGNOSIS — I5020 Unspecified systolic (congestive) heart failure: Secondary | ICD-10-CM | POA: Insufficient documentation

## 2022-04-23 DIAGNOSIS — E785 Hyperlipidemia, unspecified: Secondary | ICD-10-CM | POA: Diagnosis not present

## 2022-04-23 DIAGNOSIS — E113299 Type 2 diabetes mellitus with mild nonproliferative diabetic retinopathy without macular edema, unspecified eye: Secondary | ICD-10-CM | POA: Diagnosis not present

## 2022-04-23 LAB — POCT GLYCOSYLATED HEMOGLOBIN (HGB A1C): Hemoglobin A1C: 8 % — AB (ref 4.0–5.6)

## 2022-04-23 LAB — LIPID PANEL
Cholesterol: 134 mg/dL (ref 0–200)
HDL: 45.7 mg/dL (ref >39.00)
LDL Cholesterol: 62 mg/dL (ref 0–99)
NonHDL: 88.26
Total CHOL/HDL Ratio: 3
Triglycerides: 132 mg/dL (ref 0.0–149.0)
VLDL: 26.4 mg/dL (ref 0.0–40.0)

## 2022-04-23 MED ORDER — EMPAGLIFLOZIN 25 MG PO TABS: 25.0000 mg | ORAL_TABLET | Freq: Every day | ORAL | 3 refills | Status: DC

## 2022-04-23 NOTE — Patient Instructions (Signed)
We will go ahead and increase the Jardiance to 25 mg daily.    New prescription has been sent  Verify that you are taking the Jardiance 100 mg daily  Set up 3 month follow up.

## 2022-04-23 NOTE — Progress Notes (Signed)
Established Patient Office Visit  Subjective   Patient ID: Adam [name], male    DOB: Oct 21, 1951  Age: 71 y.o. MRN: [id number]  Chief Complaint  Patient presents with   Follow-up    HPI   Seen for medical follow-up.  Refer to last note in March for details.  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  He had very low ejection fraction of 20 to 25%.  Has been followed by cardiology since then.  Unfortunately still smoking about a pack of cigarettes per day.  No chest pain.  Diabetes poorly controlled with last A1c 8.3%.  Recent LDL cholesterol 78.  He states he is compliant with all medications.  Currently on Lipitor 40 mg daily.  We discussed possible addition of Zetia if lipids still up.  We actually added Januvia back in March but he is not totally clear if he is taking this or not.  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 He has a 50.00 pack-year smoking history. He has never used smokeless tobacco. He reports that he does not currently use alcohol. He reports that he does not use drugs. family history includes Cancer in his paternal grandfather; Heart disease in his father. Allergies  Allergen Reactions   Naproxen Sodium Swelling    Review of Systems  Constitutional:  Negative for malaise/fatigue.  Eyes:  Negative for blurred vision.  Respiratory:  Negative for shortness of breath.   Cardiovascular:  Negative for chest pain.  Neurological:  Negative for dizziness, weakness and headaches.      Objective:     BP 110/60 (BP Location: Left Arm, Patient Position: Sitting, Cuff Size: Normal)   Pulse 80    Temp 97.8 F (36.6 C) (Oral)   Ht 5' 10.5" (1.791 m)   Wt 163 lb 8 oz (74.2 kg)   SpO2 99%   BMI 23.13 kg/m    Physical Exam Constitutional:      Appearance: He is well-developed.  HENT:     Right Ear: External ear normal.     Left Ear: External ear normal.  Eyes:     Pupils: Pupils are equal, round, and reactive to light.  Neck:     Thyroid: No thyromegaly.  Cardiovascular:     Rate and Rhythm: Normal rate and regular rhythm.  Pulmonary:     Effort: Pulmonary effort is normal. No respiratory distress.     Breath sounds: Normal breath sounds. No wheezing or rales.  Musculoskeletal:     Cervical back: Neck supple.     Right lower leg: No edema.     Left lower leg: No edema.  Neurological:     Mental Status: He is alert and oriented to person, place, and time.      Results for orders placed or performed in visit on 04/23/22  POCT glycosylated hemoglobin (Hb A1C)  Result Value Ref Range   Hemoglobin A1C 8.0 (A) 4.0 - 5.6 %   HbA1c POC (<> result, manual entry)     HbA1c, POC (prediabetic range)     HbA1c, POC (controlled diabetic range)  The ASCVD Risk score (Arnett DK, et al., 2019) failed to calculate for the following reasons:   The patient has a prior MI or stroke diagnosis    Assessment & Plan:   #1 type 2 diabetes slightly improved from 8.3 to 8.0%.  Recent addition of Januvia but not clear if he is taking this.  -Confirm if on Januvia -Increase Jardiance to 25 mg daily -We did discuss possible GLP-1 medication if not getting to goal with the above  #2 hyperlipidemia.  Goal LDL less than 55.  Recheck lipid panel today.  If still above goal consider addition of Zetia  #3 hypertension stable and well-controlled  #4 history of systolic heart failure.  Managed by cardiology.  Not clear why he is not on ACE inhibitor or ARB   Return in about 3 months (around 07/24/2022).    Evelena Peat, MD

## 2022-05-04 DIAGNOSIS — I371 Nonrheumatic pulmonary valve insufficiency: Secondary | ICD-10-CM | POA: Diagnosis not present

## 2022-05-04 DIAGNOSIS — I361 Nonrheumatic tricuspid (valve) insufficiency: Secondary | ICD-10-CM | POA: Diagnosis not present

## 2022-05-04 DIAGNOSIS — I517 Cardiomegaly: Secondary | ICD-10-CM | POA: Diagnosis not present

## 2022-05-04 DIAGNOSIS — I5189 Other ill-defined heart diseases: Secondary | ICD-10-CM | POA: Diagnosis not present

## 2022-05-04 DIAGNOSIS — I351 Nonrheumatic aortic (valve) insufficiency: Secondary | ICD-10-CM | POA: Diagnosis not present

## 2022-05-04 DIAGNOSIS — I340 Nonrheumatic mitral (valve) insufficiency: Secondary | ICD-10-CM | POA: Diagnosis not present

## 2022-06-15 DIAGNOSIS — I2510 Atherosclerotic heart disease of native coronary artery without angina pectoris: Secondary | ICD-10-CM | POA: Diagnosis not present

## 2022-06-15 DIAGNOSIS — E118 Type 2 diabetes mellitus with unspecified complications: Secondary | ICD-10-CM | POA: Diagnosis not present

## 2022-06-15 DIAGNOSIS — E785 Hyperlipidemia, unspecified: Secondary | ICD-10-CM | POA: Diagnosis not present

## 2022-06-15 DIAGNOSIS — I110 Hypertensive heart disease with heart failure: Secondary | ICD-10-CM | POA: Diagnosis not present

## 2022-06-15 DIAGNOSIS — I429 Cardiomyopathy, unspecified: Secondary | ICD-10-CM | POA: Diagnosis not present

## 2022-07-26 ENCOUNTER — Other Ambulatory Visit: Payer: Self-pay | Admitting: Family Medicine

## 2022-07-30 ENCOUNTER — Other Ambulatory Visit: Payer: Self-pay | Admitting: Family Medicine

## 2022-08-12 ENCOUNTER — Other Ambulatory Visit: Payer: Self-pay | Admitting: Family Medicine

## 2022-09-10 DIAGNOSIS — J019 Acute sinusitis, unspecified: Secondary | ICD-10-CM | POA: Diagnosis not present

## 2022-09-10 DIAGNOSIS — R051 Acute cough: Secondary | ICD-10-CM | POA: Diagnosis not present

## 2022-09-10 DIAGNOSIS — Z6822 Body mass index (BMI) 22.0-22.9, adult: Secondary | ICD-10-CM | POA: Diagnosis not present

## 2022-09-24 ENCOUNTER — Other Ambulatory Visit: Payer: Self-pay | Admitting: Family Medicine

## 2022-09-24 DIAGNOSIS — E113299 Type 2 diabetes mellitus with mild nonproliferative diabetic retinopathy without macular edema, unspecified eye: Secondary | ICD-10-CM

## 2022-10-04 ENCOUNTER — Ambulatory Visit: Payer: Medicare Other

## 2022-10-15 ENCOUNTER — Ambulatory Visit (INDEPENDENT_AMBULATORY_CARE_PROVIDER_SITE_OTHER): Payer: Medicare Other

## 2022-10-15 VITALS — Ht 70.5 in | Wt 163.0 lb

## 2022-10-15 DIAGNOSIS — Z0000 Encounter for general adult medical examination without abnormal findings: Secondary | ICD-10-CM

## 2022-10-15 NOTE — Progress Notes (Signed)
Subjective:   DEVINE KLINGEL is a 71 y.o. male who presents for Medicare Annual/Subsequent preventive examination.  Review of Systems    Virtual Visit via Telephone Note  I connected with  Allean Found on 10/15/22 at 10:45 AM EST by telephone and verified that I am speaking with the correct person using two identifiers.  Location: Patient: Home Provider: Office Persons participating in the virtual visit: patient/Nurse Health Advisor   I discussed the limitations, risks, security and privacy concerns of performing an evaluation and management service by telephone and the availability of in person appointments. The patient expressed understanding and agreed to proceed.  Interactive audio and video telecommunications were attempted between this nurse and patient, however failed, due to patient having technical difficulties OR patient did not have access to video capability.  We continued and completed visit with audio only.  Some vital signs may be absent or patient reported.   Criselda Peaches, LPN  Cardiac Risk Factors include: advanced age (>18mn, >>13women);diabetes mellitus;male gender     Objective:    Today's Vitals   10/15/22 1056  Weight: 163 lb (73.9 kg)  Height: 5' 10.5" (1.791 m)   Body mass index is 23.06 kg/m.     10/15/2022   11:03 AM 09/28/2021    9:08 AM 10/03/2020   11:24 AM 10/01/2019   11:39 AM 04/26/2018    9:36 AM 04/25/2017    9:29 AM 01/20/2015    9:00 AM  Advanced Directives  Does Patient Have a Medical Advance Directive? _0  No No  Would patient like information on creating a medical advance directive? No - Patient declined  No - Patient declined Yes (MAU/Ambulatory/Procedural Areas - Information given)  No - Patient declined No - patient declined information    Current Medications (verified) Outpatient Encounter Medications as of 10/15/2022  Medication Sig   aspirin 325 MG tablet Take 650 mg by mouth daily.   atorvastatin (LIPITOR) 40  MG tablet Take 1 tablet (40 mg total) by mouth daily.   clopidogrel (PLAVIX) 75 MG tablet Take 75 mg by mouth daily.   empagliflozin (JARDIANCE) 25 MG TABS tablet Take 1 tablet (25 mg total) by mouth daily before breakfast.   glimepiride (AMARYL) 2 MG tablet Take 1 tablet (2 mg total) by mouth daily with breakfast.   glucose blood (ACCU-CHEK AVIVA PLUS) test strip Use to check blood sugars once daily. Dx: E11.65   Lancet Devices (ACCU-CHEK SOFTCLIX) lancets Use to check blood sugars once daily. Dx: E11.65   metFORMIN (GLUCOPHAGE) 500 MG tablet TAKE TWO TABLETS BY MOUTH TWICE DAILY @ 9AM & 5PM   metoprolol succinate (TOPROL-XL) 25 MG 24 hr tablet Take 25 mg by mouth daily.   ondansetron (ZOFRAN) 4 MG tablet    ONE TOUCH LANCETS MISC Use 3-4 times as week as directed   sildenafil (REVATIO) 20 MG tablet Take 2 to 5 tablets one hour prior to sexual activity.   sitaGLIPtin (JANUVIA) 100 MG tablet TAKE ONE TABLET BY MOUTH DAILY AT 9AM   tamsulosin (FLOMAX) 0.4 MG CAPS capsule TAKE ONE CAPSULE BY MOUTH DAILY AT 9AM   No facility-administered encounter medications on file as of 10/15/2022.    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   PERIPHERAL VASCULAR DISEASE 08/27/2010   WEIGHT LOSS 08/27/2010   Past Surgical History:  Procedure Laterality Date   BACK SURGERY  2003   CHOLECYSTECTOMY  1990   KNEE SURGERY  1995   left   Family History  Problem Relation Age of Onset   Heart disease Father    Cancer Paternal Grandfather        lung   Social History   Socioeconomic History   Marital status: Single    Spouse name: Not on file   Number of children: 3   Years of education: 4 years college   Highest education level: Bachelor's degree (e.g., BA, AB, BS)  Occupational History   Not on file  Tobacco Use   Smoking status: Every  Day    Packs/day: 1.00    Years: 50.00    Total pack years: 50.00    Types: Cigarettes   Smokeless tobacco: Never  Vaping Use   Vaping Use: Never used  Substance and Sexual Activity   Alcohol use: Not Currently    Alcohol/week: 0.0 standard drinks of alcohol    Comment: occasional   Drug use: No   Sexual activity: Not on file  Other Topics Concern   Not on file  Social History Narrative   Not on file   Social Determinants of Health   Financial Resource Strain: Low Risk  (10/15/2022)   Overall Financial Resource Strain (CARDIA)    Difficulty of Paying Living Expenses: Not hard at all  Food Insecurity: No Food Insecurity (10/15/2022)   Hunger Vital Sign    Worried About Running Out of Food in the Last Year: Never true    Ran Out of Food in the Last Year: Never true  Transportation Needs: No Transportation Needs (10/15/2022)   PRAPARE - Hydrologist (Medical): No    Lack of Transportation (Non-Medical): No  Physical Activity: Inactive (10/15/2022)   Exercise Vital Sign    Days of Exercise per Week: 0 days    Minutes of Exercise per Session: 0 min  Stress: No Stress Concern Present (10/15/2022)   [place]    Feeling of Stress : Not at all  Social Connections: Socially Isolated (10/15/2022)   Social Connection and Isolation Panel [NHANES]    Frequency of Communication with Friends and Family: More than three times a week    Frequency of Social Gatherings with Friends and Family: More than three times a week    Attends Religious Services: Never    Marine scientist or Organizations: No    Attends Music therapist: Never    Marital Status: Divorced    Tobacco Counseling Ready to quit: No Counseling given: Yes   Clinical Intake:  Pre-visit preparation completed: No  Pain : No/denies painNutrition Risk Assessment:  Has the patient had any N/V/D within  the last 2 months?  No  Does the patient have any non-healing wounds?  No  Has the patient had any unintentional weight loss or weight gain?  No   Diabetes:  Is the patient diabetic?  Yes  If diabetic, was a CBG obtained today?  No  Did the patient bring in their glucometer from home?  No  How often do you monitor your CBG's? PRN.   Financial Strains and Diabetes Management:  Are you having any financial strains with the device, your supplies or your medication? No .  Does the patient want to be seen by Chronic Care Management for management of their diabetes?  No  Would the  patient like to be referred to a Nutritionist or for Diabetic Management?  No   Diabetic Exams:  Diabetic Eye Exam: Completed No. Overdue for diabetic eye exam. Pt has been advised about the importance in completing this exam. A referral has been placed today. Message sent to referral coordinator for scheduling purposes. Advised pt to expect a call from office referred to regarding appt.  Diabetic Foot Exam: Completed No. Pt has been advised about the importance in completing this exam. Pt is scheduled for diabetic foot exam on Followed by PCP.       BMI - recorded: 23.06 Nutritional Status: BMI of 19-24  Normal Nutritional Risks: None Diabetes: Yes CBG done?: No Did pt. bring in CBG monitor from home?: No  How often do you need to have someone help you when you read instructions, pamphlets, or other written materials from your doctor or pharmacy?: 1 - Never  Diabetic?  Yes  Interpreter Needed?: No  Information entered by :: Rolene Arbour LPN   Activities of Daily Living    10/15/2022   11:02 AM  In your present state of health, do you have any difficulty performing the following activities:  Hearing? 0  Vision? 0  Difficulty concentrating or making decisions? 0  Walking or climbing stairs? 0  Dressing or bathing? 0  Doing errands, shopping? 0  Preparing Food and eating ? N  Using the Toilet?  N  In the past six months, have you accidently leaked urine? Y  Comment Followed by PCP  Do you have problems with loss of bowel control? N  Managing your Medications? N  Managing your Finances? N  Housekeeping or managing your Housekeeping? N    Patient Care Team: Eulas Post, MD as PCP - General Orth[facility], [facility] (Specialist)  Indicate any recent Medical Services you may have received from other than [facility] providers in the past year (date may be approximate).     Assessment:   This is a routine wellness examination for Jaevian.  Hearing/Vision screen Hearing Screening - Comments:: Denies hearing difficulties   Vision Screening - Comments:: Wears rx glasses - up to date with routine eye exams with  Deferred  Dietary issues and exercise activities discussed: Current Exercise Habits: The patient does not participate in regular exercise at present, Exercise limited by: None identified   Goals Addressed               This Visit's Progress     No current goals (pt-stated)         Depression Screen    10/15/2022   11:00 AM 09/28/2021    9:09 AM 10/03/2020   11:26 AM 10/01/2019   11:42 AM 04/26/2018    9:38 AM 04/25/2017    9:29 AM 04/26/2016    9:41 AM  PHQ 2/9 Scores  PHQ - 2 Score 0 0 0 0 0 0 0  PHQ- 9 Score   0        Fall Risk    10/15/2022   11:02 AM 09/28/2021    9:08 AM 10/03/2020   11:26 AM 10/01/2019   11:42 AM 04/26/2018    9:38 AM  Fall Risk   Falls in the past year? 0 0 0 0 No  Number falls in past yr: 0  0 0   Injury with Fall? 0  0    Risk for fall due to : No Fall Risks Medication side effect Medication side effect Medication side effect   Follow up  Falls prevention discussed Falls evaluation completed;Education provided;Falls prevention discussed Falls evaluation completed;Falls prevention discussed Falls evaluation completed;Education provided;Falls prevention discussed     FALL RISK PREVENTION PERTAINING TO THE HOME:  Any stairs in or around the  home? Yes  If so, are there any without handrails? No  Home free of loose throw rugs in walkways, pet beds, electrical cords, etc? Yes  Adequate lighting in your home to reduce risk of falls? Yes   ASSISTIVE DEVICES UTILIZED TO PREVENT FALLS:  Life alert? No  Use of a cane, walker or w/c? No  Grab bars in the bathroom? No  Shower chair or bench in shower? No  Elevated toilet seat or a handicapped toilet? No   TIMED UP AND GO:  Was the test performed? No . Audio Visit  Cognitive Function:        10/15/2022   11:03 AM 09/28/2021    9:10 AM 10/01/2019   11:44 AM  6CIT Screen  What Year? 0 points 0 points 0 points  What month? 0 points 0 points 0 points  What time? 0 points 0 points 0 points  Count back from 20 0 points 0 points 0 points  Months in reverse 0 points 2 points 0 points  Repeat phrase 0 points 4 points 0 points  Total Score 0 points 6 points 0 points    Immunizations Immunization History  Administered Date(s) Administered   Moderna Sars-Covid-2 Vaccination 12/27/2019, 01/24/2020   Pneumococcal Conjugate-13 05/09/2014   Pneumococcal Polysaccharide-23 04/25/2017    TDAP status: Due, Education has been provided regarding the importance of this vaccine. Advised may receive this vaccine at local pharmacy or Health Dept. Aware to provide a copy of the vaccination record if obtained from local pharmacy or Health Dept. Verbalized acceptance and understanding.  Flu Vaccine status: Declined, Education has been provided regarding the importance of this vaccine but patient still declined. Advised may receive this vaccine at local pharmacy or Health Dept. Aware to provide a copy of the vaccination record if obtained from local pharmacy or Health Dept. Verbalized acceptance and understanding.  Pneumococcal vaccine status: Up to date  Covid-19 vaccine status: Completed vaccines  Qualifies for Shingles Vaccine? Yes   Zostavax completed No   Shingrix Completed?: No.     Education has been provided regarding the importance of this vaccine. Patient has been advised to call insurance company to determine out of pocket expense if they have not yet received this vaccine. Advised may also receive vaccine at local pharmacy or Health Dept. Verbalized acceptance and understanding.  Screening Tests Health Maintenance  Topic Date Due   DTaP/Tdap/Td (1 - Tdap) Never done   Lung Cancer Screening  Never done   Diabetic kidney evaluation - Urine ACR  05/02/2018   FOOT EXAM  05/09/2021   OPHTHALMOLOGY EXAM  07/11/2021   COVID-19 Vaccine (3 - Moderna risk series) 10/31/2022 (Originally 02/21/2020)   Zoster Vaccines- Shingrix (1 of 2) 01/14/2023 (Originally [birth year]/1967)   COLONOSCOPY (Pts 45-73yr Insurance coverage will need to be confirmed)  10/16/2023 (Originally 11/26/1993)   HEMOGLOBIN A1C  10/23/2022   Diabetic kidney evaluation - eGFR measurement  12/26/2022   Medicare Annual Wellness (AWV)  10/16/2023   Pneumonia Vaccine 71 Years old  Completed   Hepatitis C Screening  Completed   HPV VACCINES  Aged Out   INFLUENZA VACCINE  Discontinued    Health Maintenance  Health Maintenance Due  Topic Date Due   DTaP/Tdap/Td (1 - Tdap) Never done  Lung Cancer Screening  Never done   Diabetic kidney evaluation - Urine ACR  05/02/2018   FOOT EXAM  05/09/2021   OPHTHALMOLOGY EXAM  07/11/2021    Colorectal cancer screening: Referral to GI placed Patient deferred. Pt aware the office will call re: appt.  Lung Cancer Screening: (Low Dose CT Chest recommended if Age 56-80 years, 30 pack-year currently smoking OR have quit w/in 15years.) does qualify.   Lung Cancer Screening Referral: Patient deferred  Additional Screening:  Hepatitis C Screening: does qualify; Completed 7/918  Vision Screening: Recommended annual ophthalmology exams for early detection of glaucoma and other disorders of the eye. Is the patient up to date with their annual eye exam?  Yes  Who is the  provider or what is the name of the office in which the patient attends annual eye exams? Deferred If pt is not established with a provider, would they like to be referred to a provider to establish care? No .   Dental Screening: Recommended annual dental exams for proper oral hygiene  Community Resource Referral / Chronic Care Management:  CRR required this visit?  No   CCM required this visit?  No      Plan:     I have personally reviewed and noted the following in the patient's chart:   Medical and social history Use of alcohol, tobacco or illicit drugs  Current medications and supplements including opioid prescriptions. Patient is not currently taking opioid prescriptions. Functional ability and status Nutritional status Physical activity Advanced directives List of other physicians Hospitalizations, surgeries, and ER visits in previous 12 months Vitals Screenings to include cognitive, depression, and falls Referrals and appointments  In addition, I have reviewed and discussed with patient certain preventive protocols, quality metrics, and best practice recommendations. A written personalized care plan for preventive services as well as general preventive health recommendations were provided to patient.     Criselda Peaches, LPN   [id number]/12/4915   Nurse Notes: Patient due Diabetic Kidney Evaluation- Urine ACR and Lung Cancer Screening

## 2022-10-15 NOTE — Patient Instructions (Addendum)
Mr. Scouten , Thank you for taking time to come for your Medicare Wellness Visit. I appreciate your ongoing commitment to your health goals. Please review the following plan we discussed and let me know if I can assist you in the future.   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      Patient Stated      09/28/2021, get better        This is a list of the screening recommended for you and due dates:  Health Maintenance  Topic Date Due   DTaP/Tdap/Td vaccine (1 - Tdap) Never done   Screening for Lung Cancer  Never done   Yearly kidney health urinalysis for diabetes  05/02/2018   Complete foot exam   05/09/2021   Eye exam for diabetics  07/11/2021   COVID-19 Vaccine (3 - Moderna risk series) 10/31/2022*   Zoster (Shingles) Vaccine (1 of 2) 01/14/2023*   Colon Cancer Screening  10/16/2023*   Hemoglobin A1C  10/23/2022   Yearly kidney function blood test for diabetes  12/26/2022   Medicare Annual Wellness Visit  10/16/2023   Pneumonia Vaccine  Completed   Hepatitis C Screening: USPSTF Recommendation to screen - Ages 18-79 yo.  Completed   HPV Vaccine  Aged Out   Flu Shot  Discontinued  *Topic was postponed. The date shown is not the original due date.    Advanced directives: Advance directive discussed with you today. Even though you declined this today, please call our office should you change your mind, and we can give you the proper paperwork for you to fill out.   Conditions/risks identified: None  Next appointment: Follow up in one year for your annual wellness visit.    Preventive Care 61 Years and Older, Male  Preventive care refers to lifestyle choices and visits with your health care provider that can promote health and wellness. What does preventive care include? A yearly physical exam. This is also called an annual well  check. Dental exams once or twice a year. Routine eye exams. Ask your health care provider how often you should have your eyes checked. Personal lifestyle choices, including: Daily care of your teeth and gums. Regular physical activity. Eating a healthy diet. Avoiding tobacco and drug use. Limiting alcohol use. Practicing safe sex. Taking low doses of aspirin every day. Taking vitamin and mineral supplements as recommended by your health care provider. What happens during an annual well check? The services and screenings done by your health care provider during your annual well check will depend on your age, overall health, lifestyle risk factors, and family history of disease. Counseling  Your health care provider may ask you questions about your: Alcohol use. Tobacco use. Drug use. Emotional well-being. Home and relationship well-being. Sexual activity. Eating habits. History of falls. Memory and ability to understand (cognition). Work and work Astronomer. Screening  You may have the following tests or measurements: Height, weight, and BMI. Blood pressure. Lipid and cholesterol levels. These may be checked every 5 years, or more frequently if you are over 87 years old. Skin check. Lung cancer screening. You may have this screening every year starting at age 57 if you have a 30-pack-year history of smoking and currently smoke or have quit within the past  15 years. Fecal occult blood test (FOBT) of the stool. You may have this test every year starting at age 11. Flexible sigmoidoscopy or colonoscopy. You may have a sigmoidoscopy every 5 years or a colonoscopy every 10 years starting at age 91. Prostate cancer screening. Recommendations will vary depending on your family history and other risks. Hepatitis C blood test. Hepatitis B blood test. Sexually transmitted disease (STD) testing. Diabetes screening. This is done by checking your blood sugar (glucose) after you have not  eaten for a while (fasting). You may have this done every 1-3 years. Abdominal aortic aneurysm (AAA) screening. You may need this if you are a current or former smoker. Osteoporosis. You may be screened starting at age 84 if you are at high risk. Talk with your health care provider about your test results, treatment options, and if necessary, the need for more tests. Vaccines  Your health care provider may recommend certain vaccines, such as: Influenza vaccine. This is recommended every year. Tetanus, diphtheria, and acellular pertussis (Tdap, Td) vaccine. You may need a Td booster every 10 years. Zoster vaccine. You may need this after age 29. Pneumococcal 13-valent conjugate (PCV13) vaccine. One dose is recommended after age 21. Pneumococcal polysaccharide (PPSV23) vaccine. One dose is recommended after age 44. Talk to your health care provider about which screenings and vaccines you need and how often you need them. This information is not intended to replace advice given to you by your health care provider. Make sure you discuss any questions you have with your health care provider. Document Released: 11/07/2015 Document Revised: 06/30/2016 Document Reviewed: 08/12/2015 Elsevier Interactive Patient Education  2017 [employer].  Fall Prevention in the Home Falls can cause injuries. They can happen to people of all ages. There are many things you can do to make your home safe and to help prevent falls. What can I do on the outside of my home? Regularly fix the edges of walkways and driveways and fix any cracks. Remove anything that might make you trip as you walk through a door, such as a raised step or threshold. Trim any bushes or trees on the path to your home. Use bright outdoor lighting. Clear any walking paths of anything that might make someone trip, such as rocks or tools. Regularly check to see if handrails are loose or broken. Make sure that both sides of any steps have  handrails. Any raised decks and porches should have guardrails on the edges. Have any leaves, snow, or ice cleared regularly. Use sand or salt on walking paths during winter. Clean up any spills in your garage right away. This includes oil or grease spills. What can I do in the bathroom? Use night lights. Install grab bars by the toilet and in the tub and shower. Do not use towel bars as grab bars. Use non-skid mats or decals in the tub or shower. If you need to sit down in the shower, use a plastic, non-slip stool. Keep the floor dry. Clean up any water that spills on the floor as soon as it happens. Remove soap buildup in the tub or shower regularly. Attach bath mats securely with double-sided non-slip rug tape. Do not have throw rugs and other things on the floor that can make you trip. What can I do in the bedroom? Use night lights. Make sure that you have a light by your bed that is easy to reach. Do not use any sheets or blankets that are too big for your  bed. They should not hang down onto the floor. Have a firm chair that has side arms. You can use this for support while you get dressed. Do not have throw rugs and other things on the floor that can make you trip. What can I do in the kitchen? Clean up any spills right away. Avoid walking on wet floors. Keep items that you use a lot in easy-to-reach places. If you need to reach something above you, use a strong step stool that has a grab bar. Keep electrical cords out of the way. Do not use floor polish or wax that makes floors slippery. If you must use wax, use non-skid floor wax. Do not have throw rugs and other things on the floor that can make you trip. What can I do with my stairs? Do not leave any items on the stairs. Make sure that there are handrails on both sides of the stairs and use them. Fix handrails that are broken or loose. Make sure that handrails are as long as the stairways. Check any carpeting to make sure  that it is firmly attached to the stairs. Fix any carpet that is loose or worn. Avoid having throw rugs at the top or bottom of the stairs. If you do have throw rugs, attach them to the floor with carpet tape. Make sure that you have a light switch at the top of the stairs and the bottom of the stairs. If you do not have them, ask someone to add them for you. What else can I do to help prevent falls? Wear shoes that: Do not have high heels. Have rubber bottoms. Are comfortable and fit you well. Are closed at the toe. Do not wear sandals. If you use a stepladder: Make sure that it is fully opened. Do not climb a closed stepladder. Make sure that both sides of the stepladder are locked into place. Ask someone to hold it for you, if possible. Clearly mark and make sure that you can see: Any grab bars or handrails. First and last steps. Where the edge of each step is. Use tools that help you move around (mobility aids) if they are needed. These include: Canes. Walkers. Scooters. Crutches. Turn on the lights when you go into a dark area. Replace any light bulbs as soon as they burn out. Set up your furniture so you have a clear path. Avoid moving your furniture around. If any of your floors are uneven, fix them. If there are any pets around you, be aware of where they are. Review your medicines with your doctor. Some medicines can make you feel dizzy. This can increase your chance of falling. Ask your doctor what other things that you can do to help prevent falls. This information is not intended to replace advice given to you by your health care provider. Make sure you discuss any questions you have with your health care provider. Document Released: 08/07/2009 Document Revised: 03/18/2016 Document Reviewed: 11/15/2014 Elsevier Interactive Patient Education  2017 [employer].

## 2022-11-18 ENCOUNTER — Other Ambulatory Visit: Payer: Self-pay | Admitting: Family Medicine

## 2022-12-13 ENCOUNTER — Telehealth: Payer: Self-pay | Admitting: Family Medicine

## 2022-12-13 DIAGNOSIS — E118 Type 2 diabetes mellitus with unspecified complications: Secondary | ICD-10-CM | POA: Diagnosis not present

## 2022-12-13 DIAGNOSIS — I10 Essential (primary) hypertension: Secondary | ICD-10-CM | POA: Diagnosis not present

## 2022-12-13 DIAGNOSIS — I2510 Atherosclerotic heart disease of native coronary artery without angina pectoris: Secondary | ICD-10-CM | POA: Diagnosis not present

## 2022-12-13 DIAGNOSIS — E119 Type 2 diabetes mellitus without complications: Secondary | ICD-10-CM | POA: Diagnosis not present

## 2022-12-13 DIAGNOSIS — E785 Hyperlipidemia, unspecified: Secondary | ICD-10-CM | POA: Diagnosis not present

## 2022-12-13 LAB — LAB REPORT - SCANNED
A1c: 9.8
EGFR: 58

## 2022-12-13 NOTE — Telephone Encounter (Signed)
Pt's A1c was 9.8 when tested at his cardiologists office. Please advise him of the next steps he needs to take

## 2022-12-14 NOTE — Telephone Encounter (Signed)
ATC but could not reach due to voicemail being full

## 2022-12-15 MED ORDER — GLIPIZIDE 5 MG PO TABS: ORAL_TABLET | ORAL | 0 refills | Status: DC

## 2022-12-15 NOTE — Telephone Encounter (Signed)
Patient informed of the message below. Rx sent

## 2022-12-15 NOTE — Addendum Note (Signed)
Addended by: Nilda Riggs on: 12/15/2022 01:58 PM   Modules accepted: Orders

## 2022-12-16 ENCOUNTER — Telehealth: Payer: Self-pay | Admitting: Family Medicine

## 2022-12-16 NOTE — Telephone Encounter (Signed)
Pt called to say MD took him off a medication, and now he cannot remember which one he is supposed to stop taking.  Please call him back ASAP!!

## 2022-12-16 NOTE — Telephone Encounter (Signed)
Patient provided clarification on prescription

## 2022-12-20 ENCOUNTER — Other Ambulatory Visit: Payer: Self-pay | Admitting: Family Medicine

## 2022-12-20 DIAGNOSIS — E113299 Type 2 diabetes mellitus with mild nonproliferative diabetic retinopathy without macular edema, unspecified eye: Secondary | ICD-10-CM

## 2022-12-24 ENCOUNTER — Telehealth: Payer: Self-pay | Admitting: *Deleted

## 2022-12-24 DIAGNOSIS — E113299 Type 2 diabetes mellitus with mild nonproliferative diabetic retinopathy without macular edema, unspecified eye: Secondary | ICD-10-CM

## 2022-12-24 DIAGNOSIS — I509 Heart failure, unspecified: Secondary | ICD-10-CM

## 2022-12-24 DIAGNOSIS — M810 Age-related osteoporosis without current pathological fracture: Secondary | ICD-10-CM

## 2022-12-24 DIAGNOSIS — E785 Hyperlipidemia, unspecified: Secondary | ICD-10-CM

## 2022-12-24 NOTE — Telephone Encounter (Signed)
Pharmacy referral entered as requested.

## 2022-12-28 ENCOUNTER — Telehealth: Payer: Self-pay

## 2022-12-28 NOTE — Progress Notes (Unsigned)
Patient ID: Adam [name], male   DOB: 10-26-1950, 72 y.o.   MRN: [id number]  [facility] Pharmacy Team  Reason for Encounter:Chart prep for initial encounter with Adam [name] on 12/30/32 at 11 am via phone call.  Spoke with patient on 12/30/2022   Have you seen any other providers since your last visit? No  Any changes in your medications or health? Patient reports none  Any side effects from any medications? Patient reports none, he states if he takes his medications without eating first he does experience some dizziness so has since been sure to eat prior.  Do you have an symptoms or problems not managed by your medications? Patient reports not that he can tell.  Any concerns about your health right now? Patient reports no  Has your provider asked that you check blood pressure, blood sugar, or follow special diet at home? Patient reports he is to be checking his sugars but has not been, he reports he just got a glucometer and testing supplies on yesterday and is planning to start on today.  Do you get any type of exercise on a regular basis? Patient reports he is not using any assisted devices for mobility.  Can you think of a goal you would like to reach for your health? Patient reports not at this time.  Do you have any problems getting your medications? Patient reports he [facility] and is liking it so far.  Is there anything that you would like to discuss during the appointment? Patient reports none.     Chart review:  Recent office visits:  10/15/22 Adam Peaches, LPN - Patient presented via phone for Medicare Annual Wellness exam. No medication changes.   04/23/22 Adam [name], Adam Sierras, MD - Patient presented for Type 2 diabetes and other concerns. Increased Jardiance to 2 mg daily.  Recent consult visits:  09/10/22 Claims encounter for Acute cough and other concerns. No provider or other visit details available.    Hospital visits:  None in previous 6 months  Fill History : Jardiance 10 mg tablet 12/10/2022 30   ALBUTEROL HFA 90MCG (9GM) AER 10/02/2022 34   atorvastatin 40 mg tablet 10/15/2022 30   ATORVASTATIN CALCIUM  40 MG TABS 10/11/2022 90   CLOPIDOGREL  75 MG TABS 11/12/2022 90   glimepiride 2 mg tablet 12/10/2022 30   GlipiZIDE '5MG'$        TAB 12/15/2022 30   metformin 500 mg tablet 12/10/2022 30   METOPROLOL SUCCINATE ER  25 MG TB24 12/13/2022 90   NITROGLYCER 0.'4MG'$    SUB 11/11/2021 8   Januvia 100 mg tablet 12/10/2022 30   tamsulosin 0.4 mg capsule 12/10/2022 30   Star Rating Drugs:  Empagliflozin 25 mg - Last filled 12/10/22 30 DS at [facility] Glipizide 5 mg - Last filled 12/15/22 30 DS at [facility] Metformin 500 mg - Last filled 12/10/22 30 DS at [facility] Sitagliptin 100 mg - Last filled 12/10/22 30 DS at [facility] Atorvastatin 40 mg - Last filled 10/15/22 30 DS at [facility] (Also filled 10/11/22 90 DS at [facility]) Glimepiride 2 mg - Last filled 12/10/22 30 DS at [facility]   Care Gaps: TDAP - Overdue Lung Cancer Screen - Overdue Diabetic Urine - Overdue COVID Booster - Overdue Foot Exam - Overdue Eye Exam - Overdue HGB A1C - Overdue Diabetic Urine - Overdue Zoster Vaccine - Postponed Flu Vaccine - Postponed Colonoscopy - Postponed  AWV - 10/15/22   [place] Clinical Pharmacist Assistant [phone]

## 2022-12-30 NOTE — Progress Notes (Signed)
Care Management & Coordination Services Pharmacy Note  12/30/2022 Name:  Adam [name] MRN:  [id number] DOB:  10/07/1951  Summary: A1C not at goal <7 (9.8 when checked last per patient report) BP at goal <130/80 LDL at goal <70 No treatment for osteoporosis dx 2019 (never started fosamax that was prescribed then)  Recommendations/Changes made from today's visit: -Check sugars at least once daily. Does not qualify for CGM coverage right now due to no insulin use -Counseled extensively on need for diet change to get sugars in check, or addition of insulin would be likely -Check BP once weekly and monitor for signs of fluid retention, weighing daily is best way to do so -Limit fried, fatty foods to keep LDL at goal -Enrolled in [facility] for packaging, delivery, and medsync services  Follow up plan: DM call in 1 month Pharmacist visit in 3 months   Subjective: Adam [name] is an 72 y.o. year old male who is a primary patient of Burchette, Alinda Sierras, MD.  The care coordination team was consulted for assistance with disease management and care coordination needs.    Engaged with patient by telephone for initial visit.  Recent office visits: 10/15/22 Criselda Peaches, LPN - Patient presented via phone for Medicare Annual Wellness exam. No medication changes.    04/23/22 Burchette, Alinda Sierras, MD - Patient presented for Type 2 diabetes and other concerns. Increased Jardiance to 2 mg daily.  Recent consult visits: None  Hospital visits: None in previous 6 months   Objective:  Lab Results  Component Value Date   CREATININE 1.19 12/25/2021   BUN 9 12/25/2021   GFR 60.78 12/25/2021   GFRNONAA 76.98 08/27/2010   NA 138 12/25/2021   K 4.0 12/25/2021   CALCIUM 9.2 12/25/2021   CO2 27 12/25/2021   GLUCOSE 274 (H) 12/25/2021    Lab Results  Component Value Date/Time   HGBA1C 8.0 (A) 04/23/2022 10:41 AM   HGBA1C 8.3 (H) 12/25/2021 11:44 AM   HGBA1C 8.6 (H) 06/26/2021 10:21  AM   HGBA1C 7.6 04/21/2020 12:00 AM   HGBA1C 6.4 04/26/2018 12:00 AM   GFR 60.78 12/25/2021 11:44 AM   GFR 56.38 (L) 07/31/2021 11:45 AM   MICROALBUR 2.8 (H) 05/02/2017 11:20 AM   MICROALBUR <0.7 04/26/2016 10:20 AM    Last diabetic Eye exam:  Lab Results  Component Value Date/Time   HMDIABEYEEXA Retinopathy (A) 07/11/2020 12:00 AM    Last diabetic Foot exam:  Lab Results  Component Value Date/Time   HMDIABFOOTEX normal 05/09/2014 12:00 AM     Lab Results  Component Value Date   CHOL 134 04/23/2022   HDL 45.70 04/23/2022   LDLCALC 62 04/23/2022   LDLDIRECT 157.6 10/08/2010   TRIG 132.0 04/23/2022   CHOLHDL 3 04/23/2022       Latest Ref Rng & Units 12/25/2021   11:44 AM 07/31/2021   11:45 AM 02/22/2020    2:00 PM  Hepatic Function  Total Protein 6.0 - 8.3 g/dL 7.0  6.9  6.7   Albumin 3.5 - 5.2 g/dL 3.9  4.0  4.3   AST 0 - 37 U/L 20  14  11    ALT 0 - 53 U/L 15  9  7    Alk Phosphatase 39 - 117 U/L 100  64  57   Total Bilirubin 0.2 - 1.2 mg/dL 0.5  0.8  0.6   Bilirubin, Direct 0.0 - 0.3 mg/dL  0.1  0.1     Lab Results  Component Value Date/Time   TSH 0.93 08/23/2018 11:34 AM   TSH 0.59 08/27/2010 11:31 AM       Latest Ref Rng & Units 08/27/2010   11:31 AM  CBC  WBC 4.5 - 10.5 10*3/microliter 12.0   Hemoglobin 13.0 - 17.0 g/dL 17.9   Hematocrit 39.0 - 52.0 % 51.2   Platelets 150.0 - 400.0 K/uL 209.0     Lab Results  Component Value Date/Time   VD25OH 25.76 (L) 08/23/2018 11:34 AM    Clinical ASCVD: Yes  The ASCVD Risk score (Arnett DK, et al., 2019) failed to calculate for the following reasons:   The patient has a prior MI or stroke diagnosis       10/15/2022   11:00 AM 09/28/2021    9:09 AM 10/03/2020   11:26 AM  Depression screen PHQ 2/9  Decreased Interest 0 0 0  Down, Depressed, Hopeless 0 0 0  PHQ - 2 Score 0 0 0  Altered sleeping   0  Tired, decreased energy   0  Change in appetite   0  Feeling bad or failure about yourself    0  Trouble  concentrating   0  Moving slowly or fidgety/restless   0  Suicidal thoughts   0  PHQ-9 Score   0  Difficult doing work/chores   Not difficult at all     Social History   Tobacco Use  Smoking Status Every Day   Packs/day: 1.00   Years: 50.00   Total pack years: 50.00   Types: Cigarettes  Smokeless Tobacco Never   BP Readings from Last 3 Encounters:  04/23/22 110/60  12/25/21 122/70  07/31/21 140/70   Pulse Readings from Last 3 Encounters:  04/23/22 80  12/25/21 99  07/31/21 82   Wt Readings from Last 3 Encounters:  10/15/22 163 lb (73.9 kg)  04/23/22 163 lb 8 oz (74.2 kg)  12/25/21 158 lb 3 oz (71.8 kg)   BMI Readings from Last 3 Encounters:  10/15/22 23.06 kg/m  04/23/22 23.13 kg/m  12/25/21 22.38 kg/m    Allergies  Allergen Reactions   Naproxen Sodium Swelling    Medications Reviewed Today     Reviewed by Criselda Peaches, LPN (Licensed Practical Nurse) on 10/15/22 at 1053  Med List Status: <None>   Medication Order Taking? Sig Documenting Provider Last Dose Status Informant  aspirin 325 MG tablet [id number] No Take 650 mg by mouth daily. [provider] Taking Active   atorvastatin (LIPITOR) 40 MG tablet [id number] No Take 1 tablet (40 mg total) by mouth daily. Eulas Post, MD Taking Active   clopidogrel (PLAVIX) 75 MG tablet [id number] No Take 75 mg by mouth daily. [provider] Taking Active   empagliflozin (JARDIANCE) 25 MG TABS tablet [id number]  Take 1 tablet (25 mg total) by mouth daily before breakfast. Eulas Post, MD  Active   glimepiride (AMARYL) 2 MG tablet [id number] No Take 1 tablet (2 mg total) by mouth daily with breakfast. Eulas Post, MD Taking Active   glucose blood (ACCU-CHEK AVIVA PLUS) test strip [id number] No Use to check blood sugars once daily. Dx: E11.65 Eulas Post, MD Taking Active   Lancet Devices [facility]) lancets [id number] No Use to check blood sugars once daily. Dx: E11.65  Eulas Post, MD Taking Active   metFORMIN (GLUCOPHAGE) 500 MG tablet [id number]  TAKE TWO TABLETS BY MOUTH TWICE DAILY @ 9AM & 5PM Eulas Post, MD  Active  metoprolol succinate (TOPROL-XL) 25 MG 24 hr tablet [id number] No Take 25 mg by mouth daily. [provider] Taking Active   ondansetron (ZOFRAN) 4 MG tablet [id number] No  [provider] Taking Active   ONE TOUCH LANCETS MISC [phone] No Use 3-4 times as week as directed [provider] Taking Active   sildenafil (REVATIO) 20 MG tablet [id number] No Take 2 to 5 tablets one hour prior to sexual activity. Eulas Post, MD Taking Active   sitaGLIPtin (JANUVIA) 100 MG tablet [id number]  TAKE ONE TABLET BY MOUTH DAILY AT Charlton Amor, MD  Active   tamsulosin (FLOMAX) 0.4 MG CAPS capsule [id number]  TAKE ONE CAPSULE BY MOUTH DAILY AT Charlton Amor, MD  Active             SDOH:  (Social Determinants of Health) assessments and interventions performed: Yes SDOH Interventions    [place] Office Visit from 10/15/2022 in [place] [facility] [place] from 10/03/2020 in [place] [facility] [place] Interventions Intervention Not Indicated Intervention Not Indicated  Housing Interventions Intervention Not Indicated Intervention Not Indicated  Transportation Interventions Intervention Not Indicated Intervention Not Indicated  Utilities Interventions Intervention Not Indicated --  Alcohol Usage Interventions Intervention Not Indicated (Score <7) --  Depression Interventions/Treatment  -- PHQ2-9 Score <4 Follow-up Not Indicated  Financial Strain Interventions Intervention Not Indicated Intervention Not Indicated  Physical Activity Interventions Intervention Not Indicated Intervention Not Indicated  Stress Interventions Intervention Not Indicated Intervention Not Indicated  Social Connections  Interventions Intervention Not Indicated Intervention Not Indicated       Medication Assistance: None required.  Patient affirms current coverage meets needs.  Medication Access: Within the past 30 days, how often has patient missed a dose of medication? No Is a pillbox or other method used to improve adherence? No  Factors that may affect medication adherence? no barriers identified Are meds synced by current pharmacy? No  Are meds delivered by current pharmacy? Yes  Does patient experience delays in picking up medications due to transportation concerns? No   [facility] Reviewed: Is patient disadvantaged to use [facility]?: No  Current Rx insurance plan: [facility] Name and location of Current pharmacy:  [facility] - [place], [place] [address] Phone: [phone] Fax: [phone] [facility] services reviewed with patient today?: Yes  Patient requests to transfer care to [facility]?: Yes , onboarding form submitted   Compliance/Adherence/Medication fill history: Care Gaps: TDAP - Overdue Lung Cancer Screen - Overdue Diabetic Urine - Overdue COVID Booster - Overdue Foot Exam - Overdue Eye Exam - Overdue HGB A1C - Overdue Diabetic Urine - Overdue Zoster Vaccine - Postponed Flu Vaccine - Postponed Colonoscopy - Postponed AWV - 10/15/22  Star-Rating Drugs: Jardiance 25mg  PDC 98% Glipizide 5mg  PDC 100% Metformin 500mg  PDC 100% Januvia 100mg  PDC 86% Atorvastatin 40mg  PDC 100% Losartan 25mg  LF 12/18/22 30DS   Assessment/Plan   Hyperlipidemia: (LDL goal < 70) -Controlled -Current treatment: Atorvastatin 40mg  1 qd Appropriate, Effective, Safe, Accessible -Medications previously tried: Simvastatin  -Current dietary patterns: cut back on fruits, does like to eat a lot of fried foods -Current exercise habits: yardwork, housework -Educated on Cholesterol goals;  Benefits of statin for ASCVD  risk reduction; Importance of limiting foods high in cholesterol; Exercise goal of 150 minutes per week; -Recommended to continue current medication  Diabetes (A1c goal <7%) -Uncontrolled -  Current medications: Jardiance 25mg  1 qd Appropriate, Effective, Safe, Accessible Glipizide 5mg  1 qd Appropriate, Query Effective, Safe, Accessible Metformin 500mg  2 tabs BID Appropriate, Effective, Safe, Accessible Januvia 100mg  1 qd Appropriate, Effective, Safe, Accessible -Medications previously tried: Glimepiride  -Current home glucose readings fasting glucose: did not check post prandial glucose: 165 Has not been checking for months, started back yesterday once daily prior to our appt today -Denies hypoglycemic/hyperglycemic symptoms -Current meal patterns:  drinks: sweet tea with sugar -Current exercise: see above -Educated on A1c and blood sugar goals; Complications of diabetes including kidney damage, retinal damage, and cardiovascular disease; Exercise goal of 150 minutes per week; Prevention and management of hypoglycemic episodes; Benefits of routine self-monitoring of blood sugar; Carbohydrate counting and/or plate method -Counseled to check feet daily and get yearly eye exams -Counseled on diet and exercise extensively Recommended to continue current medication -Patient does not qualify for CGM due to insulin use, discussed the fact that without diet changes, likely patient may need to be started on insulin in the future and would qualify for CGM at that point.  -Recommended patient check sugars once to twice daily (discussed fasting and post-prandial goals). 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%) -NYHA Class: I (no actitivty limitation) -AHA HF Stage: C (Heart disease and symptoms present) -Current treatment: Jardiance 25mg  1  qd Appropriate, Effective, Safe, Accessible Metoprolol XL 25mg  1 qd Appropriate, Effective, Safe, Accessible Losartan 25mg  1/2 tab daily Appropriate, Effective, Safe, Accessible Furosemide 40mg  1 qd prn swelling Appropriate, Effective, Safe, Accessible -Medications previously tried: None  -Current home BP/HR readings: has not been checking at home -Current home daily weights: has not been checking at home; prefers to monitor for signs of fluid buildup -Current dietary habits: mindful of salt intake -Current exercise habits: see above -Past ECHO was post-MI, scheduled for updated ECHO on 01/07/23 -Educated on Benefits of medications for managing symptoms and prolonging life Importance of weighing daily; if you gain more than 3 pounds in one day or 5 pounds in one week, contact office Importance of blood pressure control -Counseled on diet and exercise extensively Recommended to continue current medication  Osteoporosis / Osteopenia (Goal Prevent bone fracture) -Not ideally controlled -Last DEXA Scan: 08/14/2018  -Patient is a candidate for pharmacologic treatment due to T-Score < -2.5 in femoral neck -Current treatment  None -Medications previously tried: was prescribed fosamax but never started  -Recommend [phone] units of vitamin D daily. Recommend 1200 mg of calcium daily from dietary and supplemental sources. Recommend weight-bearing and muscle strengthening exercises for building and maintaining bone density. Counseled on fall risk -Pt wants to discuss potential of starting fosamax as previously prescribed at next visit  CAD (Goal: Slow progression of atherosclerosis (plaques / blockages) throughout your body to reduce risk of heart attack and strokes) -Not assessed today Current Medication Therapy:  Aspirin 81mg  once daily Appropriate, Effective, Safe, Accessible  Plavix 75mg  once daily Appropriate, Effective, Safe, Accessible  Query BPH  -Not assessed today -Current treatment   Tamsulosin 0.4mg  1 qd   Maren Reamer Clinical Pharmacist [phone]

## 2022-12-31 ENCOUNTER — Ambulatory Visit: Payer: Medicare Other

## 2022-12-31 NOTE — Progress Notes (Cosign Needed)
Patient ID: Adam [name], male   DOB: [birth year], 72 y.o.   MRN: [id number] 12/31/22 After initial with Pharm D requested transfers on the following medications to be sent to [facility] Per Theo Dills.  [facility] the following: Atorvastatin 40 mg Clopidogrel 75 mg Metoprolol XL 25 mg  Nitroglycerin 0.4 mg  Requested from prescriber Jamse Arn as [facility] out of fills: Furosemide 40 mg Losartan 25 mg    [place] Pharmacist Assistant [phone]

## 2023-01-07 DIAGNOSIS — I340 Nonrheumatic mitral (valve) insufficiency: Secondary | ICD-10-CM | POA: Diagnosis not present

## 2023-01-07 DIAGNOSIS — I517 Cardiomegaly: Secondary | ICD-10-CM | POA: Diagnosis not present

## 2023-01-07 DIAGNOSIS — I509 Heart failure, unspecified: Secondary | ICD-10-CM | POA: Diagnosis not present

## 2023-01-07 DIAGNOSIS — I5189 Other ill-defined heart diseases: Secondary | ICD-10-CM | POA: Diagnosis not present

## 2023-01-07 NOTE — Progress Notes (Signed)
Call to Adam [name] office to request the following prescriptions be sent over as [facility] reports nothing sent from [facility] Atorvastatin 40 mg Clopidogrel 75 mg Metoprolol XL 25 mg  Nitroglycerin 0.4 mg   [place] Clinical Pharmacist Assistant [phone]

## 2023-01-08 ENCOUNTER — Other Ambulatory Visit: Payer: Self-pay | Admitting: Family Medicine

## 2023-01-11 DIAGNOSIS — I447 Left bundle-branch block, unspecified: Secondary | ICD-10-CM | POA: Diagnosis not present

## 2023-01-11 DIAGNOSIS — I2510 Atherosclerotic heart disease of native coronary artery without angina pectoris: Secondary | ICD-10-CM | POA: Diagnosis not present

## 2023-01-11 DIAGNOSIS — I429 Cardiomyopathy, unspecified: Secondary | ICD-10-CM | POA: Diagnosis not present

## 2023-01-12 ENCOUNTER — Other Ambulatory Visit: Payer: Self-pay | Admitting: Family Medicine

## 2023-01-17 ENCOUNTER — Telehealth: Payer: Self-pay

## 2023-01-17 DIAGNOSIS — E113299 Type 2 diabetes mellitus with mild nonproliferative diabetic retinopathy without macular edema, unspecified eye: Secondary | ICD-10-CM

## 2023-01-17 MED ORDER — TAMSULOSIN HCL 0.4 MG PO CAPS: ORAL_CAPSULE | ORAL | 0 refills | Status: DC

## 2023-01-17 MED ORDER — METFORMIN HCL 500 MG PO TABS: ORAL_TABLET | ORAL | 0 refills | Status: DC

## 2023-01-17 MED ORDER — SITAGLIPTIN PHOSPHATE 100 MG PO TABS: ORAL_TABLET | ORAL | 0 refills | Status: DC

## 2023-01-17 MED ORDER — GLIPIZIDE 5 MG PO TABS: 5.0000 mg | ORAL_TABLET | Freq: Every day | ORAL | 0 refills | Status: DC

## 2023-01-17 MED ORDER — EMPAGLIFLOZIN 25 MG PO TABS: 25.0000 mg | ORAL_TABLET | Freq: Every day | ORAL | 0 refills | Status: DC

## 2023-01-17 NOTE — Telephone Encounter (Signed)
-----   Message from Maren Reamer, [facility] sent at 01/14/2023 10:50 AM EDT ----- Regarding: Med Refills Pt is enrolling into [facility] packaging and med sync delivery services. Needs new prescriptions sent for the following medications to continue enrollment:  Jardiance 25mg   Glpizide 5mg  Meformin 500mg  Januvia 100mg  Tamsulosin 0.4mg   Pharmacy: (added to his chart) [facility] - [place], [place] - [address]  Phone: [phone] Fax: [phone]   Thank you! [place] Pharmacist [phone]

## 2023-01-17 NOTE — Telephone Encounter (Signed)
Rx sent 

## 2023-01-28 DIAGNOSIS — Z7982 Long term (current) use of aspirin: Secondary | ICD-10-CM | POA: Diagnosis not present

## 2023-01-28 DIAGNOSIS — Z951 Presence of aortocoronary bypass graft: Secondary | ICD-10-CM | POA: Diagnosis not present

## 2023-01-28 DIAGNOSIS — I5023 Acute on chronic systolic (congestive) heart failure: Secondary | ICD-10-CM | POA: Diagnosis not present

## 2023-01-28 DIAGNOSIS — J449 Chronic obstructive pulmonary disease, unspecified: Secondary | ICD-10-CM | POA: Diagnosis not present

## 2023-01-28 DIAGNOSIS — Z7902 Long term (current) use of antithrombotics/antiplatelets: Secondary | ICD-10-CM | POA: Diagnosis not present

## 2023-01-28 DIAGNOSIS — F17200 Nicotine dependence, unspecified, uncomplicated: Secondary | ICD-10-CM | POA: Diagnosis not present

## 2023-01-28 DIAGNOSIS — Z7984 Long term (current) use of oral hypoglycemic drugs: Secondary | ICD-10-CM | POA: Diagnosis not present

## 2023-01-28 DIAGNOSIS — I447 Left bundle-branch block, unspecified: Secondary | ICD-10-CM | POA: Diagnosis not present

## 2023-01-28 DIAGNOSIS — I429 Cardiomyopathy, unspecified: Secondary | ICD-10-CM | POA: Diagnosis not present

## 2023-01-28 DIAGNOSIS — E119 Type 2 diabetes mellitus without complications: Secondary | ICD-10-CM | POA: Diagnosis not present

## 2023-01-28 DIAGNOSIS — E785 Hyperlipidemia, unspecified: Secondary | ICD-10-CM | POA: Diagnosis not present

## 2023-01-28 DIAGNOSIS — I2510 Atherosclerotic heart disease of native coronary artery without angina pectoris: Secondary | ICD-10-CM | POA: Diagnosis not present

## 2023-01-28 DIAGNOSIS — I255 Ischemic cardiomyopathy: Secondary | ICD-10-CM | POA: Diagnosis not present

## 2023-01-28 DIAGNOSIS — I10 Essential (primary) hypertension: Secondary | ICD-10-CM | POA: Diagnosis not present

## 2023-01-28 DIAGNOSIS — Z8616 Personal history of COVID-19: Secondary | ICD-10-CM | POA: Diagnosis not present

## 2023-01-28 DIAGNOSIS — Z79899 Other long term (current) drug therapy: Secondary | ICD-10-CM | POA: Diagnosis not present

## 2023-01-28 DIAGNOSIS — Z95810 Presence of automatic (implantable) cardiac defibrillator: Secondary | ICD-10-CM | POA: Diagnosis not present

## 2023-01-28 DIAGNOSIS — Z4502 Encounter for adjustment and management of automatic implantable cardiac defibrillator: Secondary | ICD-10-CM | POA: Diagnosis not present

## 2023-01-29 DIAGNOSIS — E785 Hyperlipidemia, unspecified: Secondary | ICD-10-CM | POA: Diagnosis not present

## 2023-01-29 DIAGNOSIS — Z4502 Encounter for adjustment and management of automatic implantable cardiac defibrillator: Secondary | ICD-10-CM | POA: Diagnosis not present

## 2023-01-29 DIAGNOSIS — Z79899 Other long term (current) drug therapy: Secondary | ICD-10-CM | POA: Diagnosis not present

## 2023-01-29 DIAGNOSIS — I447 Left bundle-branch block, unspecified: Secondary | ICD-10-CM | POA: Diagnosis not present

## 2023-01-29 DIAGNOSIS — Z7984 Long term (current) use of oral hypoglycemic drugs: Secondary | ICD-10-CM | POA: Diagnosis not present

## 2023-01-29 DIAGNOSIS — F17200 Nicotine dependence, unspecified, uncomplicated: Secondary | ICD-10-CM | POA: Diagnosis not present

## 2023-01-29 DIAGNOSIS — Z8616 Personal history of COVID-19: Secondary | ICD-10-CM | POA: Diagnosis not present

## 2023-01-29 DIAGNOSIS — Z981 Arthrodesis status: Secondary | ICD-10-CM | POA: Diagnosis not present

## 2023-01-29 DIAGNOSIS — Z951 Presence of aortocoronary bypass graft: Secondary | ICD-10-CM | POA: Diagnosis not present

## 2023-01-29 DIAGNOSIS — I517 Cardiomegaly: Secondary | ICD-10-CM | POA: Diagnosis not present

## 2023-01-29 DIAGNOSIS — I255 Ischemic cardiomyopathy: Secondary | ICD-10-CM | POA: Diagnosis not present

## 2023-01-29 DIAGNOSIS — Z7982 Long term (current) use of aspirin: Secondary | ICD-10-CM | POA: Diagnosis not present

## 2023-01-29 DIAGNOSIS — I2510 Atherosclerotic heart disease of native coronary artery without angina pectoris: Secondary | ICD-10-CM | POA: Diagnosis not present

## 2023-01-29 DIAGNOSIS — Z95810 Presence of automatic (implantable) cardiac defibrillator: Secondary | ICD-10-CM | POA: Diagnosis not present

## 2023-01-29 DIAGNOSIS — Z7902 Long term (current) use of antithrombotics/antiplatelets: Secondary | ICD-10-CM | POA: Diagnosis not present

## 2023-01-29 DIAGNOSIS — J449 Chronic obstructive pulmonary disease, unspecified: Secondary | ICD-10-CM | POA: Diagnosis not present

## 2023-01-29 DIAGNOSIS — I10 Essential (primary) hypertension: Secondary | ICD-10-CM | POA: Diagnosis not present

## 2023-01-29 DIAGNOSIS — I429 Cardiomyopathy, unspecified: Secondary | ICD-10-CM | POA: Diagnosis not present

## 2023-01-29 DIAGNOSIS — E119 Type 2 diabetes mellitus without complications: Secondary | ICD-10-CM | POA: Diagnosis not present

## 2023-01-29 DIAGNOSIS — J939 Pneumothorax, unspecified: Secondary | ICD-10-CM | POA: Diagnosis not present

## 2023-02-04 DIAGNOSIS — I259 Chronic ischemic heart disease, unspecified: Secondary | ICD-10-CM | POA: Diagnosis not present

## 2023-02-04 DIAGNOSIS — I447 Left bundle-branch block, unspecified: Secondary | ICD-10-CM | POA: Diagnosis not present

## 2023-02-04 DIAGNOSIS — I255 Ischemic cardiomyopathy: Secondary | ICD-10-CM | POA: Diagnosis not present

## 2023-02-06 ENCOUNTER — Other Ambulatory Visit: Payer: Self-pay | Admitting: Family Medicine

## 2023-02-08 ENCOUNTER — Telehealth: Payer: Self-pay

## 2023-02-08 NOTE — Progress Notes (Signed)
Patient ID: Adam [name], male   DOB: [birth year], 72 y.o.   MRN: [id number]  Care Management & Coordination Services Pharmacy Team  Reason for Encounter: Diabetes  Contacted patient to discuss diabetes disease state. Spoke with patient on 02/14/2023    Current antihyperglycemic regimen:  Jardiance 25mg  1 qd Appropriate, Effective, Safe, Accessible Glipizide 5mg  1 qd Appropriate, Query Effective, Safe, Accessible Metformin 500mg  2 tabs BID Appropriate, Effective, Safe, Accessible Januvia 100mg  1 qd Appropriate, Effective, Safe, Accessible   Patient verbally confirms he is taking the above medications as directed. Yes   02/11/23 Call to patient he reports he has not been checking his sugars as he is aware the should be. He states he is not at home presently but will check the next few days for me to check back in with him next week. He reports he has however been taking all medications as prescribed.  What diet changes have been made to improve diabetes control? Patient reports no changes  What recent interventions/DTPs have been made to improve glycemic control:  Patient reports none  Have there been any recent hospitalizations or ED visits since last visit with PharmD? No  Patient denies hypoglycemic symptoms, including None  Patient denies hyperglycemic symptoms, including none  How often are you checking your blood sugar? Infrequently per patient  What are your blood sugars ranging?  Fasting: 135 After meals: 163  During the week, how often does your blood glucose drop below 70? Never    Adherence Review: Is the patient currently on a STATIN medication? Yes Is the patient currently on ACE/ARB medication? No Does the patient have >5 day gap between last estimated fill dates? No   Chart Updates:  Recent office visits:  None  Recent consult visits:  01/28/23 Adam [name]  - [facility] for  Cardiomyopathy unspecified. No other visit details available.  Hospital visits:  None in previous 6 months  Medications: Outpatient Encounter Medications as of 02/08/2023  Medication Sig   aspirin EC 81 MG tablet Take 81 mg by mouth daily. Swallow whole.   atorvastatin (LIPITOR) 40 MG tablet Take 1 tablet (40 mg total) by mouth daily.   clopidogrel (PLAVIX) 75 MG tablet Take 75 mg by mouth daily.   empagliflozin (JARDIANCE) 25 MG TABS tablet Take 1 tablet (25 mg total) by mouth daily before breakfast.   furosemide (LASIX) 40 MG tablet Take 40 mg by mouth daily as needed for edema or fluid.   glipiZIDE (GLUCOTROL) 5 MG tablet Take 1 tablet by mouth once daily   glucose blood (ACCU-CHEK AVIVA PLUS) test strip Use to check blood sugars once daily. Dx: E11.65   Lancet Devices (ACCU-CHEK SOFTCLIX) lancets Use to check blood sugars once daily. Dx: E11.65   losartan (COZAAR) 25 MG tablet Take 12.5 mg by mouth daily. 1/2 TAB DAILY   metFORMIN (GLUCOPHAGE) 500 MG tablet TAKE TWO TABLETS BY MOUTH TWICE DAILY @9AM -5PM PATIENT NEEDS AN APPOINTMENT   metoprolol succinate (TOPROL-XL) 25 MG 24 hr tablet Take 25 mg by mouth daily.   ONE TOUCH LANCETS MISC Use 3-4 times as week as directed   sildenafil (REVATIO) 20 MG tablet Take 2 to 5 tablets one hour prior to sexual activity.   sitaGLIPtin (JANUVIA) 100 MG tablet TAKE ONE TABLET BY MOUTH DAILY AT 9AM   tamsulosin (FLOMAX) 0.4 MG CAPS capsule TAKE ONE CAPSULE BY MOUTH DAILY AT 9AM   No facility-administered encounter medications on file as  of 02/08/2023.    Recent Relevant Labs: Lab Results  Component Value Date/Time   HGBA1C 8.0 (A) 04/23/2022 10:41 AM   HGBA1C 8.3 (H) 12/25/2021 11:44 AM   HGBA1C 8.6 (H) 06/26/2021 10:21 AM   HGBA1C 7.6 04/21/2020 12:00 AM   HGBA1C 6.4 04/26/2018 12:00 AM   MICROALBUR 2.8 (H) 05/02/2017 11:20 AM   MICROALBUR <0.7 04/26/2016 10:20 AM    Kidney Function Lab Results  Component Value Date/Time   CREATININE 1.19  12/25/2021 11:44 AM   CREATININE 1.27 07/31/2021 11:45 AM   GFR 60.78 12/25/2021 11:44 AM   GFRNONAA 76.98 08/27/2010 11:31 AM    Star Rating Drugs:  Empagliflozin 25 mg - Last filled 02/08/23 30 DS at [facility] Glipizide 5 mg - Last filled 02/08/23 30 DS at [facility] Metformin 500 mg - Last filled 02/04/23 30 DS at [facility] Sitagliptin 100 mg - Last filled 02/04/23 30 DS at [facility] Atorvastatin 40 mg - Last filled 02/04/23 30 DS at [facility]    Care Gaps: TDAP - Overdue Zoster Vaccine - Overdue Lung Cancer Screen - Overdue Diabetic Urine - Overdue COVID Booster - Overdue Foot Exam - Overdue Eye Exam - Overdue Colonoscopy - Postponed AWV - 10/15/22 Pharm Follow up 03/2023   Adam [name] CMA Clinical Pharmacist Assistant [phone]

## 2023-02-25 ENCOUNTER — Other Ambulatory Visit: Payer: Self-pay | Admitting: Family Medicine

## 2023-02-28 ENCOUNTER — Telehealth: Payer: Self-pay

## 2023-02-28 NOTE — Progress Notes (Signed)
Patient ID: Adam [name], male   DOB: 08-05-1951, 72 y.o.   MRN: [id number] Care Management & Coordination Services Pharmacy Team  Reason for Encounter: Medication coordination and delivery  Contacted patient to discuss medications and coordinate delivery from [facility]. Spoke with patient on 02/28/2023  Cycle dispensing form sent to [facility] for review.   Patient is due for 1st adherence delivery on: 03/09/23  This delivery to include: Adherence Packaging  30 Days   Atorvastatin 40 mg : Take one tab at Breakfast Jardiance 25 mg : Take one tab at Breakfast Glipizide 5 mg : Take one tab at Breakfast Losartan 25 mg : take 1/2 tab at Breakfast (12.5 mg) Metformin 500 mg : Take 2 tabs at Breakfast and 2 tabs at Dinner Metoprolol XL 25 mg : Take one tab at Breakfast Januvia 100 mg: Take one tab at Breakfast Tamsulosin 0.4 mg: Take one capsule at Breakfast Asprin 81 mg: Take one at Breakfast  Clopidogrel 75 mg: Take one at Breakfast ( To go to pt in July) patient made aware and he is in agreement    No refill request needed.  Confirmed delivery date of 03/09/23, advised patient that pharmacy will contact them the morning of delivery.   Any concerns about your medications? No   Chart review: Recent office visits:  None   Recent consult visits:  01/28/23 Adam [name] - Patient presented for Cardiomyopathy. No other visit details available.   Hospital visits:  None in previous 6 months  Medications: Outpatient Encounter Medications as of 02/28/2023  Medication Sig   aspirin EC 81 MG tablet Take 81 mg by mouth daily. Swallow whole.   atorvastatin (LIPITOR) 40 MG tablet Take 1 tablet (40 mg total) by mouth daily.   clopidogrel (PLAVIX) 75 MG tablet Take 75 mg by mouth daily.   empagliflozin (JARDIANCE) 25 MG TABS tablet Take 1 tablet (25 mg total) by mouth daily before breakfast.   furosemide (LASIX) 40 MG tablet Take 40 mg by mouth daily as needed for edema or fluid.    glipiZIDE (GLUCOTROL) 5 MG tablet Take 1 tablet by mouth once daily   glucose blood (ACCU-CHEK AVIVA PLUS) test strip Use to check blood sugars once daily. Dx: E11.65   Lancet Devices (ACCU-CHEK SOFTCLIX) lancets Use to check blood sugars once daily. Dx: E11.65   losartan (COZAAR) 25 MG tablet Take 12.5 mg by mouth daily. 1/2 TAB DAILY   metFORMIN (GLUCOPHAGE) 500 MG tablet TAKE TWO TABLETS BY MOUTH TWICE DAILY @9AM -5PM PATIENT NEEDS AN APPOINTMENT   metoprolol succinate (TOPROL-XL) 25 MG 24 hr tablet Take 25 mg by mouth daily.   ONE TOUCH LANCETS MISC Use 3-4 times as week as directed   sildenafil (REVATIO) 20 MG tablet Take 2 to 5 tablets one hour prior to sexual activity.   sitaGLIPtin (JANUVIA) 100 MG tablet TAKE ONE TABLET BY MOUTH DAILY AT 9AM   tamsulosin (FLOMAX) 0.4 MG CAPS capsule TAKE ONE CAPSULE BY MOUTH EVERY MORNING   No facility-administered encounter medications on file as of 02/28/2023.   BP Readings from Last 3 Encounters:  04/23/22 110/60  12/25/21 122/70  07/31/21 140/70    Pulse Readings from Last 3 Encounters:  04/23/22 80  12/25/21 99  07/31/21 82    Lab Results  Component Value Date/Time   HGBA1C 8.0 (A) 04/23/2022 10:41 AM   HGBA1C 8.3 (H) 12/25/2021 11:44 AM   HGBA1C 8.6 (H) 06/26/2021 10:21 AM   HGBA1C 7.6 04/21/2020 12:00 AM  HGBA1C 6.4 04/26/2018 12:00 AM   Lab Results  Component Value Date   CREATININE 1.19 12/25/2021   BUN 9 12/25/2021   GFR 60.78 12/25/2021   GFRNONAA 76.98 08/27/2010   NA 138 12/25/2021   K 4.0 12/25/2021   CALCIUM 9.2 12/25/2021   CO2 27 12/25/2021       Adam [name] CMA Clinical Pharmacist Assistant [phone]

## 2023-03-18 DIAGNOSIS — I259 Chronic ischemic heart disease, unspecified: Secondary | ICD-10-CM | POA: Diagnosis not present

## 2023-03-18 DIAGNOSIS — I429 Cardiomyopathy, unspecified: Secondary | ICD-10-CM | POA: Diagnosis not present

## 2023-03-18 DIAGNOSIS — I447 Left bundle-branch block, unspecified: Secondary | ICD-10-CM | POA: Diagnosis not present

## 2023-03-29 NOTE — Progress Notes (Signed)
Care Management & Coordination Services Pharmacy Note  03/29/2023 Name:  Adam [name] MRN:  [id number] DOB:  05/14/1951  Summary: A1C not at goal <7 (9.8 when checked last per patient report) No treatment for osteoporosis dx 2019 (never started fosamax that was prescribed then)  Recommendations/Changes made from today's visit: -Check sugars at least once daily. Does not qualify for CGM coverage right now due to no insulin use -Counseled extensively on need for diet change to get sugars in check, or addition of insulin would be likely -Check BP once weekly and monitor for signs of fluid retention, weighing daily is best way to do so -START calcium + Vit D OTC for bone health -Scheduled CPE with PCP  Follow up plan: PCP visit in July DM call in August Pharmacist visit in Sept  Subjective: Adam [name] is an 72 y.o. year old male who is a primary patient of Burchette, Elberta Fortis, MD.  The care coordination team was consulted for assistance with disease management and care coordination needs.    Engaged with patient by telephone for follow up visit.  Recent office visits: 10/15/22 Tillie Rung, LPN - Patient presented via phone for Medicare Annual Wellness exam. No medication changes.    Recent consult visits: 01/28/23 [name] - Patient presented for Cardiomyopathy. No other visit details available   Hospital visits: 01/28/23 ICD Implant - no med changes   Objective:  Lab Results  Component Value Date   CREATININE 1.19 12/25/2021   BUN 9 12/25/2021   GFR 60.78 12/25/2021   EGFR 58.0 12/13/2022   GFRNONAA 76.98 08/27/2010   NA 138 12/25/2021   K 4.0 12/25/2021   CALCIUM 9.2 12/25/2021   CO2 27 12/25/2021   GLUCOSE 274 (H) 12/25/2021    Lab Results  Component Value Date/Time   HGBA1C 8.0 (A) 04/23/2022 10:41 AM   HGBA1C 8.3 (H) 12/25/2021 11:44 AM   HGBA1C 8.6 (H) 06/26/2021 10:21 AM   HGBA1C 7.6 04/21/2020 12:00 AM   HGBA1C 6.4 04/26/2018 12:00 AM   GFR  60.78 12/25/2021 11:44 AM   GFR 56.38 (L) 07/31/2021 11:45 AM   MICROALBUR 2.8 (H) 05/02/2017 11:20 AM   MICROALBUR <0.7 04/26/2016 10:20 AM    Last diabetic Eye exam:  Lab Results  Component Value Date/Time   HMDIABEYEEXA Retinopathy (A) 07/11/2020 12:00 AM    Last diabetic Foot exam:  Lab Results  Component Value Date/Time   HMDIABFOOTEX normal 05/09/2014 12:00 AM     Lab Results  Component Value Date   CHOL 134 04/23/2022   HDL 45.70 04/23/2022   LDLCALC 62 04/23/2022   LDLDIRECT 157.6 10/08/2010   TRIG 132.0 04/23/2022   CHOLHDL 3 04/23/2022       Latest Ref Rng & Units 12/25/2021   11:44 AM 07/31/2021   11:45 AM 02/22/2020    2:00 PM  Hepatic Function  Total Protein 6.0 - 8.3 g/dL 7.0  6.9  6.7   Albumin 3.5 - 5.2 g/dL 3.9  4.0  4.3   AST 0 - 37 U/L 20  14  11    ALT 0 - 53 U/L 15  9  7    Alk Phosphatase 39 - 117 U/L 100  64  57   Total Bilirubin 0.2 - 1.2 mg/dL 0.5  0.8  0.6   Bilirubin, Direct 0.0 - 0.3 mg/dL  0.1  0.1     Lab Results  Component Value Date/Time   TSH 0.93 08/23/2018 11:34 AM   TSH 0.59  08/27/2010 11:31 AM       Latest Ref Rng & Units 08/27/2010   11:31 AM  CBC  WBC 4.5 - 10.5 10*3/microliter 12.0   Hemoglobin 13.0 - 17.0 g/dL 21.3   Hematocrit 08.6 - 52.0 % 51.2   Platelets 150.0 - 400.0 K/uL 209.0     Lab Results  Component Value Date/Time   VD25OH 25.76 (L) 08/23/2018 11:34 AM    Clinical ASCVD: Yes  The ASCVD Risk score (Arnett DK, et al., 2019) failed to calculate for the following reasons:   The patient has a prior MI or stroke diagnosis       10/15/2022   11:00 AM 09/28/2021    9:09 AM 10/03/2020   11:26 AM  Depression screen PHQ 2/9  Decreased Interest 0 0 0  Down, Depressed, Hopeless 0 0 0  PHQ - 2 Score 0 0 0  Altered sleeping   0  Tired, decreased energy   0  Change in appetite   0  Feeling bad or failure about yourself    0  Trouble concentrating   0  Moving slowly or fidgety/restless   0  Suicidal thoughts    0  PHQ-9 Score   0  Difficult doing work/chores   Not difficult at all     Social History   Tobacco Use  Smoking Status Every Day   Packs/day: 1.00   Years: 50.00   Additional pack years: 0.00   Total pack years: 50.00   Types: Cigarettes  Smokeless Tobacco Never   BP Readings from Last 3 Encounters:  04/23/22 110/60  12/25/21 122/70  07/31/21 140/70   Pulse Readings from Last 3 Encounters:  04/23/22 80  12/25/21 99  07/31/21 82   Wt Readings from Last 3 Encounters:  10/15/22 163 lb (73.9 kg)  04/23/22 163 lb 8 oz (74.2 kg)  12/25/21 158 lb 3 oz (71.8 kg)   BMI Readings from Last 3 Encounters:  10/15/22 23.06 kg/m  04/23/22 23.13 kg/m  12/25/21 22.38 kg/m    Allergies  Allergen Reactions   Naproxen Sodium Swelling    Medications Reviewed Today     Reviewed by Sherrill Raring, RPH (Pharmacist) on 12/31/22 at 1313  Med List Status: <None>   Medication Order Taking? Sig Documenting Provider Last Dose Status Informant  aspirin EC 81 MG tablet [id number] Yes Take 81 mg by mouth daily. Swallow whole. [provider] Taking Active Self  atorvastatin (LIPITOR) 40 MG tablet [id number] Yes Take 1 tablet (40 mg total) by mouth daily. Kristian Covey, MD Taking Active   clopidogrel (PLAVIX) 75 MG tablet [id number] Yes Take 75 mg by mouth daily. [provider] Taking Active   empagliflozin (JARDIANCE) 25 MG TABS tablet [id number] Yes Take 1 tablet (25 mg total) by mouth daily before breakfast. Kristian Covey, MD Taking Active   furosemide (LASIX) 40 MG tablet [id number] Yes Take 40 mg by mouth daily as needed for edema or fluid. Rivka Safer, MD  Active Self  glipiZIDE (GLUCOTROL) 5 MG tablet [id number] Yes Take 1 tablet by mouth once daily Burchette, Elberta Fortis, MD Taking Active   glucose blood (ACCU-CHEK AVIVA PLUS) test strip [id number]  Use to check blood sugars once daily. Dx: E11.65 Kristian Covey, MD  Active   Lancet Devices [facility]) lancets [id number]  Use to check blood sugars once daily. Dx: E11.65 Kristian Covey, MD  Active   losartan (COZAAR) 25 MG tablet [id number] Yes Take 25 mg  by mouth daily. Rivka Safer, MD  Active Self  metFORMIN (GLUCOPHAGE) 500 MG tablet [id number] Yes TAKE TWO TABLETS BY MOUTH TWICE DAILY @9AM -5PM PATIENT NEEDS AN APPOINTMENT Burchette, Elberta Fortis, MD Taking Active   metoprolol succinate (TOPROL-XL) 25 MG 24 hr tablet [id number] Yes Take 25 mg by mouth daily. [provider] Taking Active   ONE TOUCH LANCETS MISC [phone]  Use 3-4 times as week as directed [provider]  Active   sildenafil (REVATIO) 20 MG tablet [id number]  Take 2 to 5 tablets one hour prior to sexual activity. Kristian Covey, MD  Active   sitaGLIPtin (JANUVIA) 100 MG tablet [id number] Yes TAKE ONE TABLET BY MOUTH DAILY AT Billey Gosling, Elberta Fortis, MD Taking Active   tamsulosin (FLOMAX) 0.4 MG CAPS capsule [id number] Yes TAKE ONE CAPSULE BY MOUTH DAILY AT 9AM Burchette, Elberta Fortis, MD Taking Active             SDOH:  (Social Determinants of Health) assessments and interventions performed: Yes SDOH Interventions    Flowsheet Row Care Coordination from 12/31/2022 in [facility] Office Visit from 10/15/2022 in [facility] [place] Clinical Support from 10/03/2020 in [facility] [place]  SDOH Interventions     Food Insecurity Interventions Intervention Not Indicated Intervention Not Indicated Intervention Not Indicated  Housing Interventions Intervention Not Indicated Intervention Not Indicated Intervention Not Indicated  Transportation Interventions Intervention Not Indicated Intervention Not Indicated Intervention Not Indicated  Utilities Interventions Intervention Not Indicated Intervention Not Indicated --  Alcohol Usage Interventions -- Intervention Not Indicated (Score <7) --  Depression Interventions/Treatment  -- -- PHQ2-9 Score <4  Follow-up Not Indicated  Financial Strain Interventions -- Intervention Not Indicated Intervention Not Indicated  Physical Activity Interventions -- Intervention Not Indicated Intervention Not Indicated  Stress Interventions -- Intervention Not Indicated Intervention Not Indicated  Social Connections Interventions -- Intervention Not Indicated Intervention Not Indicated       Medication Assistance: None required.  Patient affirms current coverage meets needs.  Medication Access: Within the past 30 days, how often has patient missed a dose of medication? No Is a pillbox or other method used to improve adherence? No  Factors that may affect medication adherence? no barriers identified Are meds synced by current pharmacy? No  Are meds delivered by current pharmacy? Yes  Does patient experience delays in picking up medications due to transportation concerns? No      Compliance/Adherence/Medication fill history: Care Gaps: TDAP - Overdue Lung Cancer Screen - Overdue Diabetic Urine - Overdue COVID Booster - Overdue Foot Exam - Overdue Eye Exam - Overdue HGB A1C - Overdue Diabetic Urine - Overdue Zoster Vaccine - Postponed Flu Vaccine - Postponed Colonoscopy - Postponed AWV - 10/15/22  Star-Rating Drugs: Jardiance 25mg  PDC 98% Glipizide 5mg  PDC 100% Metformin 500mg  PDC 100% Januvia 100mg  PDC 86% Atorvastatin 40mg  PDC 100% Losartan 25mg  30DS   Assessment/Plan Diabetes (A1c goal <7%) -Uncontrolled -Current medications: Jardiance 25mg  1 qd Appropriate, Effective, Safe, Accessible Glipizide 5mg  1 qd Appropriate, Query Effective Metformin 500mg  2 tabs BID Appropriate, Effective, Safe, Accessible Januvia 100mg  1 qd Appropriate, Effective, Safe, Accessible -Medications previously tried: Glimepiride  -Current home glucose readings fasting glucose: 135-165 -Denies hypoglycemic/hyperglycemic symptoms -Current meal patterns:  drinks: sweet tea  -Current exercise: see  above -Educated on A1c and blood sugar goals; Complications of diabetes including kidney damage, retinal damage, and cardiovascular disease; Exercise goal of 150 minutes per week; Prevention and management of hypoglycemic episodes; Benefits  of routine self-monitoring of blood sugar; Carbohydrate counting and/or plate method -Counseled to check feet daily and get yearly eye exams -Counseled on diet and exercise extensively Recommended to continue current medication -Patient does not qualify for CGM due to insulin use, discussed the fact that without diet changes, likely patient may need to be started on insulin in the future and would qualify for CGM at that point.  -Recommended patient check sugars once to twice daily (discussed fasting and post-prandial goals).  -Due for updated A1C check, scheduled f/u with PCP  Heart Failure (Goal: manage symptoms and prevent exacerbations) -Not ideally controlled -Last ejection fraction: 20-25% (Date: 10/2021) -HF type: HFrEF (EF < 40%) -NYHA Class: I (no actitivty limitation) -AHA HF Stage: C (Heart disease and symptoms present) -Current treatment: Jardiance 25mg  1 qd Appropriate, Effective, Safe, Accessible Metoprolol XL 25mg  1 qd Appropriate, Effective, Safe, Accessible Losartan 25mg  1/2 tab daily Appropriate, Effective, Safe, Accessible Furosemide 40mg  1 qd prn swelling Appropriate, Effective, Safe, Accessible -Medications previously tried: None  -Current home BP/HR readings: has not been checking at home -Current home daily weights: has not been checking at home; prefers to monitor for signs of fluid buildup -Current dietary habits: mindful of salt intake -Current exercise habits: see above -Past ECHO was post-MI, scheduled for updated ECHO on 01/07/23 -Educated on Benefits of medications for managing symptoms and prolonging life Importance of weighing daily; if you gain more than 3 pounds in one day or 5 pounds in one week, contact  office Importance of blood pressure control -Counseled on diet and exercise extensively Recommended to continue current medication  Osteoporosis / Osteopenia (Goal Prevent bone fracture) -Not ideally controlled -Last DEXA Scan: 08/14/2018  -Patient is a candidate for pharmacologic treatment due to T-Score < -2.5 in femoral neck -Current treatment  None -Medications previously tried: was prescribed fosamax but never started  -Recommend [phone] units of vitamin D daily. Recommend 1200 mg of calcium daily from dietary and supplemental sources. Recommend weight-bearing and muscle strengthening exercises for building and maintaining bone density. Counseled on fall risk -Pt wants to discuss potential of starting fosamax as previously prescribed at next visit     Sherrill Raring Clinical Pharmacist [phone]

## 2023-03-31 ENCOUNTER — Telehealth: Payer: Self-pay

## 2023-03-31 NOTE — Progress Notes (Signed)
Patient ID: Adam [name], male   DOB: [birth year], 72 y.o.   MRN: [id number]  Care Management & Coordination Services Pharmacy Team  Reason for Encounter: Appointment Reminder  Contacted patient to confirm telephone appointment with Milas Kocher, PharmD on 04/01/23 at 1:30. Spoke with patient on 03/31/2023       Star Rating Drugs:  Atorvastatin 40 mg - Last filled 03/14/23  [facility] Jardiance 25 mg - Last filled 03/18/23 30 DS at [facility] Glipizide 5 mg - Last filled  03/24/23 30 DS at [facility] Losartan 25 mg - Last filled 03/14/23 30 DS at [facility] Metformin 500 mg - Last filled 03/01/23 30 DS at [employer] 100 mg - Last filled 03/14/23 30 DS at [facility]  Care Gaps: TDAP - Overdue Zoster Vaccine - Overdue Lung Cacer Screen - Overdue Diabetic Urine - Overdue COVID Booster - Overdue Foot Exam - Overdue Eye Exam - Overdue Colonoscopy - Postponed AWV - 10/15/22   Pamala Duffel CMA Clinical Pharmacist Assistant [phone]

## 2023-04-01 ENCOUNTER — Ambulatory Visit: Payer: Medicare Other

## 2023-04-04 ENCOUNTER — Telehealth: Payer: Self-pay | Admitting: Family Medicine

## 2023-04-04 NOTE — Telephone Encounter (Signed)
Laboratory states they sent over paperwork last week and today.  Adam [name] is requesting a call back.

## 2023-04-05 NOTE — Telephone Encounter (Signed)
Form given to PCP for signature.

## 2023-04-13 ENCOUNTER — Other Ambulatory Visit: Payer: Self-pay | Admitting: Family Medicine

## 2023-04-25 ENCOUNTER — Encounter: Payer: Self-pay | Admitting: *Deleted

## 2023-04-25 NOTE — Progress Notes (Signed)
[facility] Quality Team Note  Name: Adam [name] Date of Birth: 09-20-1951 MRN: [id number] Date: 04/25/2023  [facility] Quality Team has reviewed this patient's chart, please see recommendations below:  [facility] Quality Other; Pt has open gaps for colon screening, diabetic eye screening, and A1C.  Tried to call pt to offer eye event on 07/19/2023.  Had to leave voice mail.  Would provider be able to address gaps at [facility] 05/13/23?

## 2023-05-13 ENCOUNTER — Encounter: Payer: Medicare Other | Admitting: Family Medicine

## 2023-05-17 ENCOUNTER — Other Ambulatory Visit: Payer: Self-pay | Admitting: Family Medicine

## 2023-05-17 DIAGNOSIS — E113299 Type 2 diabetes mellitus with mild nonproliferative diabetic retinopathy without macular edema, unspecified eye: Secondary | ICD-10-CM

## 2023-05-18 ENCOUNTER — Other Ambulatory Visit: Payer: Self-pay | Admitting: Family Medicine

## 2023-06-20 ENCOUNTER — Other Ambulatory Visit: Payer: Self-pay | Admitting: Family Medicine

## 2023-06-20 DIAGNOSIS — E113299 Type 2 diabetes mellitus with mild nonproliferative diabetic retinopathy without macular edema, unspecified eye: Secondary | ICD-10-CM

## 2023-06-24 ENCOUNTER — Ambulatory Visit: Payer: Medicare Other | Admitting: Family Medicine

## 2023-06-24 ENCOUNTER — Encounter: Payer: Self-pay | Admitting: Family Medicine

## 2023-06-24 VITALS — BP 130/60 | HR 90 | Temp 98.3°F | Ht 70.5 in | Wt 155.3 lb

## 2023-06-24 DIAGNOSIS — N400 Enlarged prostate without lower urinary tract symptoms: Secondary | ICD-10-CM | POA: Insufficient documentation

## 2023-06-24 DIAGNOSIS — Z7984 Long term (current) use of oral hypoglycemic drugs: Secondary | ICD-10-CM | POA: Diagnosis not present

## 2023-06-24 DIAGNOSIS — E785 Hyperlipidemia, unspecified: Secondary | ICD-10-CM

## 2023-06-24 DIAGNOSIS — I5020 Unspecified systolic (congestive) heart failure: Secondary | ICD-10-CM

## 2023-06-24 DIAGNOSIS — I2510 Atherosclerotic heart disease of native coronary artery without angina pectoris: Secondary | ICD-10-CM

## 2023-06-24 DIAGNOSIS — I739 Peripheral vascular disease, unspecified: Secondary | ICD-10-CM | POA: Diagnosis not present

## 2023-06-24 DIAGNOSIS — E113299 Type 2 diabetes mellitus with mild nonproliferative diabetic retinopathy without macular edema, unspecified eye: Secondary | ICD-10-CM | POA: Diagnosis not present

## 2023-06-24 DIAGNOSIS — N401 Enlarged prostate with lower urinary tract symptoms: Secondary | ICD-10-CM

## 2023-06-24 LAB — CBC WITH DIFFERENTIAL/PLATELET
Basophils Absolute: 0.1 10*3/uL (ref 0.0–0.1)
Basophils Relative: 0.8 % (ref 0.0–3.0)
Eosinophils Absolute: 0.1 10*3/uL (ref 0.0–0.7)
Eosinophils Relative: 1.3 % (ref 0.0–5.0)
HCT: 50.1 % (ref 39.0–52.0)
Hemoglobin: 16.4 g/dL (ref 13.0–17.0)
Lymphocytes Relative: 23.5 % (ref 12.0–46.0)
Lymphs Abs: 1.9 10*3/uL (ref 0.7–4.0)
MCHC: 32.7 g/dL (ref 30.0–36.0)
MCV: 90.4 fl (ref 78.0–100.0)
Monocytes Absolute: 0.7 10*3/uL (ref 0.1–1.0)
Monocytes Relative: 8 % (ref 3.0–12.0)
Neutro Abs: 5.5 10*3/uL (ref 1.4–7.7)
Neutrophils Relative %: 66.4 % (ref 43.0–77.0)
Platelets: 178 10*3/uL (ref 150.0–400.0)
RBC: 5.55 Mil/uL (ref 4.22–5.81)
RDW: 14.2 % (ref 11.5–15.5)
WBC: 8.2 10*3/uL (ref 4.0–10.5)

## 2023-06-24 LAB — LIPID PANEL
Cholesterol: 124 mg/dL (ref 0–200)
HDL: 38.4 mg/dL — ABNORMAL LOW (ref >39.00)
LDL Cholesterol: 60 mg/dL (ref 0–99)
NonHDL: 85.22
Total CHOL/HDL Ratio: 3
Triglycerides: 125 mg/dL (ref 0.0–149.0)
VLDL: 25 mg/dL (ref 0.0–40.0)

## 2023-06-24 LAB — COMPREHENSIVE METABOLIC PANEL
ALT: 13 U/L (ref 0–53)
AST: 17 U/L (ref 0–37)
Albumin: 4.1 g/dL (ref 3.5–5.2)
Alkaline Phosphatase: 63 U/L (ref 39–117)
BUN: 16 mg/dL (ref 6–23)
CO2: 27 mEq/L (ref 19–32)
Calcium: 9.4 mg/dL (ref 8.4–10.5)
Chloride: 103 mEq/L (ref 96–112)
Creatinine, Ser: 1.38 mg/dL (ref 0.40–1.50)
GFR: 50.35 mL/min — ABNORMAL LOW (ref >60.00)
Glucose, Bld: 224 mg/dL — ABNORMAL HIGH (ref 70–99)
Potassium: 4 mEq/L (ref 3.5–5.1)
Sodium: 139 mEq/L (ref 135–145)
Total Bilirubin: 0.7 mg/dL (ref 0.2–1.2)
Total Protein: 7 g/dL (ref 6.0–8.3)

## 2023-06-24 LAB — HEMOGLOBIN A1C: Hgb A1c MFr Bld: 8.1 % — ABNORMAL HIGH (ref 4.6–6.5)

## 2023-06-24 MED ORDER — GLIPIZIDE 5 MG PO TABS: 5.0000 mg | ORAL_TABLET | Freq: Every day | ORAL | 3 refills | Status: DC

## 2023-06-24 MED ORDER — SITAGLIPTIN PHOSPHATE 100 MG PO TABS: ORAL_TABLET | ORAL | 3 refills | Status: DC

## 2023-06-24 MED ORDER — SILDENAFIL CITRATE 20 MG PO TABS: ORAL_TABLET | ORAL | 3 refills | Status: AC

## 2023-06-24 MED ORDER — METFORMIN HCL 500 MG PO TABS
ORAL_TABLET | ORAL | 3 refills | Status: AC
Start: 2023-06-24 — End: ?

## 2023-06-24 MED ORDER — EMPAGLIFLOZIN 25 MG PO TABS: ORAL_TABLET | ORAL | 3 refills | Status: DC

## 2023-06-24 MED ORDER — FINASTERIDE 5 MG PO TABS: 5.0000 mg | ORAL_TABLET | Freq: Every day | ORAL | 1 refills | Status: DC

## 2023-06-24 MED ORDER — TAMSULOSIN HCL 0.4 MG PO CAPS: ORAL_CAPSULE | ORAL | 0 refills | Status: DC

## 2023-06-24 NOTE — Progress Notes (Signed)
Established Patient Office Visit  Subjective   Patient ID: Adam [name], male    DOB: September 20, 1951  Age: 72 y.o. MRN: [id number]  Chief Complaint  Patient presents with   Medication Refill    HPI   Adam [name] has not been seen here in over a year and is here today for medical follow-up.  He has history of CAD, heart failure with reduced ejection fraction, peripheral vascular disease, type 2 diabetes, hyperlipidemia, BPH.  He still smokes about a pack of cigarettes per day.  We tried reviewing his medications but he seemed very uncertain about several of them.  His medication list includes tamsulosin, Januvia, metformin, metoprolol, losartan, glipizide, Jardiance, atorvastatin, Plavix, and aspirin.  He complains of difficulty urinating with slow stream still.  He does not like the Flomax has helped much.  Gets up generally about 2-3 times at night.  No consistent with burning with urination.  Symptoms are going on for months.  Does not monitor blood sugars regularly.  He denies any chest pains.  No orthopnea.  No increased peripheral edema recently.  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  Ejection fraction at one point was.20-25%.  Poor compliance with follow-up.  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 He has a 50 pack-year smoking history. He has never used smokeless tobacco. He reports that he does not currently use alcohol. He reports that he does not use drugs. family history includes Cancer in his paternal grandfather; Heart disease in his father. Allergies  Allergen Reactions   Naproxen Sodium Swelling      Review of Systems  Constitutional:  Negative for malaise/fatigue.  Eyes:  Negative for blurred vision.  Respiratory:  Negative for shortness of breath.   Cardiovascular:  Negative for chest pain.  Gastrointestinal:  Negative for abdominal pain.  Genitourinary:        See HPI  Neurological:  Negative for dizziness, weakness and headaches.      Objective:     BP 130/60 (BP Location: Left Arm, Patient Position: Sitting, Cuff Size: Normal)   Pulse 90   Temp 98.3 F (36.8 C) (Oral)   Ht 5' 10.5" (1.791 m)   Wt 155 lb 4.8 oz (70.4 kg)   SpO2 98%   BMI 21.97 kg/m  BP Readings from Last 3 Encounters:  06/24/23 130/60  04/23/22 110/60  12/25/21 122/70   Wt Readings from Last 3 Encounters:  06/24/23 155 lb 4.8 oz (70.4 kg)  10/15/22 163 lb (73.9 kg)  04/23/22 163 lb 8 oz (74.2 kg)      Physical Exam Vitals reviewed.  Constitutional:      Appearance: He is well-developed.  Eyes:     Pupils: Pupils are equal, round, and reactive to light.  Neck:     Thyroid: No thyromegaly.  Cardiovascular:     Rate and Rhythm: Normal rate and regular rhythm.  Pulmonary:     Effort: Pulmonary effort is normal. No respiratory distress.     Breath sounds: Normal breath sounds.  Musculoskeletal:     Cervical back: Neck supple.  Right lower leg: No edema.     Left lower leg: No edema.  Skin:    Comments: Feet reveal no skin lesions. Good distal foot pulses. Good capillary refill. No calluses. Normal sensation with monofilament testing   Neurological:     Mental Status: He is alert and oriented to person, place, and time.      No results found for any visits on 06/24/23.    The ASCVD Risk score (Arnett DK, et al., 2019) failed to calculate for the following reasons:   The patient has a prior MI or stroke diagnosis    Assessment & Plan:   #1 history of CAD.  He has heart failure with reduced ejection fraction.  Symptomatically stable.  No evidence for volume overload  at this time.  Continue aspirin and high-dose atorvastatin.  Continue close follow-up with cardiology.  He is on multiple medications for his heart failure including Jardiance, metoprolol, losartan  #2 hyperlipidemia treated with atorvastatin.  Recheck lipid and CMP  #3 type 2 diabetes.  History of poor compliance with follow-up.  On multiple medications as above.  Recheck A1c.  Recommend annual diabetic eye exam  #4 BPH with ongoing symptoms on tamsulosin.  Try addition of finasteride 5 mg once daily.  He is aware this may take several months to see effect.  Not seeing effect in 4 to 6 months consider urology referral  #5 ongoing nicotine use.  Patient counseled regarding importance of quitting.  Low motivation.  #6 health maintenance.  Recommend flu vaccine but he declines Return in about 6 months (around 12/23/2023).    Evelena Peat, MD

## 2023-06-28 MED ORDER — GLIPIZIDE 5 MG PO TABS: ORAL_TABLET | ORAL | 0 refills | Status: DC

## 2023-06-28 NOTE — Addendum Note (Signed)
Addended by: Christy Sartorius on: 06/28/2023 10:41 AM   Modules accepted: Orders

## 2023-06-30 DIAGNOSIS — Z95810 Presence of automatic (implantable) cardiac defibrillator: Secondary | ICD-10-CM | POA: Diagnosis not present

## 2023-06-30 DIAGNOSIS — I429 Cardiomyopathy, unspecified: Secondary | ICD-10-CM | POA: Diagnosis not present

## 2023-06-30 DIAGNOSIS — I259 Chronic ischemic heart disease, unspecified: Secondary | ICD-10-CM | POA: Diagnosis not present

## 2023-06-30 DIAGNOSIS — I447 Left bundle-branch block, unspecified: Secondary | ICD-10-CM | POA: Diagnosis not present

## 2023-07-07 ENCOUNTER — Telehealth: Payer: Self-pay | Admitting: Family Medicine

## 2023-07-07 ENCOUNTER — Other Ambulatory Visit (HOSPITAL_COMMUNITY): Payer: Self-pay

## 2023-07-07 ENCOUNTER — Telehealth: Payer: Self-pay

## 2023-07-07 NOTE — Telephone Encounter (Signed)
Pharmacy Patient Advocate Encounter   Received notification from CoverMyMeds that prior authorization for Sildenafil Citrate 20MG  tablets is required/requested.   Insurance verification completed.   The patient is insured through [facility] Medicare Part D.   Per test claim:  Medicare only covers for diagnosis of Pulmonary arterial hypertension or Secondary Raynaud's phenomenon. No indication of either in patient's chart.      Key: [id number]

## 2023-07-07 NOTE — Telephone Encounter (Signed)
Patient has a question on the instructions Dr. Caryl Never gave on some of his medicines.  He is ok waiting until Friday when Dr. Lucie Leather team returns.

## 2023-07-08 MED ORDER — GLIPIZIDE 5 MG PO TABS: ORAL_TABLET | ORAL | 0 refills | Status: DC

## 2023-07-08 NOTE — Telephone Encounter (Signed)
Patient informed of the message below and voiced understanding

## 2023-07-08 NOTE — Telephone Encounter (Signed)
Patient informed PA has been initiated.

## 2023-07-08 NOTE — Telephone Encounter (Signed)
Patient informed that Glipizide 5 mg was increased to one tablet BID and new rx was sent reflecting change. Patient stated that PCP informed him to d/c one of his medications but he is unsure which one. After review of chart I could not find where this was instructed by PCP.

## 2023-07-08 NOTE — Telephone Encounter (Signed)
Left a message for the patient to return my call.

## 2023-07-08 NOTE — Addendum Note (Signed)
Addended by: Christy Sartorius on: 07/08/2023 10:55 AM   Modules accepted: Orders

## 2023-07-27 ENCOUNTER — Other Ambulatory Visit: Payer: Self-pay

## 2023-07-27 ENCOUNTER — Telehealth: Payer: Self-pay

## 2023-07-27 DIAGNOSIS — E113299 Type 2 diabetes mellitus with mild nonproliferative diabetic retinopathy without macular edema, unspecified eye: Secondary | ICD-10-CM

## 2023-07-27 MED ORDER — ATORVASTATIN CALCIUM 40 MG PO TABS: 40.0000 mg | ORAL_TABLET | Freq: Every day | ORAL | 1 refills | Status: DC

## 2023-07-27 MED ORDER — GLIPIZIDE 5 MG PO TABS: ORAL_TABLET | ORAL | 1 refills | Status: DC

## 2023-07-27 MED ORDER — TAMSULOSIN HCL 0.4 MG PO CAPS: ORAL_CAPSULE | ORAL | 1 refills | Status: DC

## 2023-07-27 MED ORDER — METFORMIN HCL 500 MG PO TABS: ORAL_TABLET | ORAL | 1 refills | Status: DC

## 2023-07-27 MED ORDER — FINASTERIDE 5 MG PO TABS: 5.0000 mg | ORAL_TABLET | Freq: Every day | ORAL | 1 refills | Status: DC

## 2023-07-27 MED ORDER — SITAGLIPTIN PHOSPHATE 100 MG PO TABS: ORAL_TABLET | ORAL | 1 refills | Status: DC

## 2023-07-27 MED ORDER — EMPAGLIFLOZIN 25 MG PO TABS: ORAL_TABLET | ORAL | 1 refills | Status: DC

## 2023-07-27 NOTE — Progress Notes (Signed)
07/27/2023  Patient ID: Adam [name], male   DOB: [birth year], 72 y.o.   MRN: [id number]  Patient states he is out of all of his medications and is having difficulties getting his prescriptions transferred to [facility] from mail order.  New rx requests for medication profile sent to PCP.  Sherrill Raring, PharmD Clinical Pharmacist [phone]

## 2023-07-27 NOTE — Telephone Encounter (Signed)
-----   Message from Sherrill Raring sent at 07/27/2023  3:01 PM EDT ----- Regarding: Med Refill Request Hello,  Patient is out of all of his medications (previous [facility] pharmacy patient-[facility] states they did not get transfers to fill/[facility] has been unable to get any transfers from [facility]) and is requesting new rxs for the following be sent to his new pharmacy:  Atorvastatin 40mg  1 tab daily Clopidogrel 75mg  1 tab daily Jardiance 25mg  1 tab daily Finasteride 5mg  1 tab daily Lasix 40mg  1 tab daily prn Glipizide 5mg  1 tab BID Losartan 25mg  1 tab daily Metformin 500mg  Take 2 tabs twice daily Metoprolol XL 25mg  1 tab daily Januvia 100mg  1 tab daily Tamsulosin 0.4mg  1 capsule daily   Preferred Pharmacy Info:  [facility] [address], [place] [address]  Thank you, Sherrill Raring, PharmD Clinical Pharmacist [phone]

## 2023-07-28 ENCOUNTER — Telehealth: Payer: Self-pay

## 2023-07-28 MED ORDER — FUROSEMIDE 40 MG PO TABS: 40.0000 mg | ORAL_TABLET | Freq: Every day | ORAL | 0 refills | Status: AC | PRN

## 2023-07-28 MED ORDER — METOPROLOL SUCCINATE ER 25 MG PO TB24: 25.0000 mg | ORAL_TABLET | Freq: Every day | ORAL | 0 refills | Status: DC

## 2023-07-28 MED ORDER — CLOPIDOGREL BISULFATE 75 MG PO TABS: 75.0000 mg | ORAL_TABLET | Freq: Every day | ORAL | 0 refills | Status: DC

## 2023-07-28 MED ORDER — LOSARTAN POTASSIUM 25 MG PO TABS: 12.5000 mg | ORAL_TABLET | Freq: Every day | ORAL | 0 refills | Status: DC

## 2023-07-28 NOTE — Telephone Encounter (Signed)
Contacted patient to let him know prescriptions had been sent [facility] for pickup.

## 2023-08-08 ENCOUNTER — Telehealth: Payer: Self-pay | Admitting: Family Medicine

## 2023-08-08 DIAGNOSIS — E118 Type 2 diabetes mellitus with unspecified complications: Secondary | ICD-10-CM | POA: Diagnosis not present

## 2023-08-08 DIAGNOSIS — E785 Hyperlipidemia, unspecified: Secondary | ICD-10-CM | POA: Diagnosis not present

## 2023-08-08 DIAGNOSIS — I2510 Atherosclerotic heart disease of native coronary artery without angina pectoris: Secondary | ICD-10-CM | POA: Diagnosis not present

## 2023-08-08 DIAGNOSIS — Z0000 Encounter for general adult medical examination without abnormal findings: Secondary | ICD-10-CM | POA: Diagnosis not present

## 2023-08-08 DIAGNOSIS — I10 Essential (primary) hypertension: Secondary | ICD-10-CM | POA: Diagnosis not present

## 2023-08-08 NOTE — Telephone Encounter (Signed)
Adam [name] with [facility] called to F/U on the fax sent to [place] on 08/03/23. Fax can be found in MD's eFax folder.  Adam [name] advised it can take anywhere from 3 days to one week for turnaround.

## 2023-08-09 NOTE — Telephone Encounter (Signed)
I spoke with the patient and he reported he did not request these labs and forms will be discarded

## 2023-08-11 NOTE — Telephone Encounter (Signed)
Alecia Lemming from [employer] called and stated that the pt stated he was still waiting on the forms to be signed and sent back to them; so I read to Alecia Lemming what the note said from 10/15 however Alecia Lemming insisted on getting the pt on the line.   I disconnected the call and called the pt myself and the pt confirmed that he does not want any labs done through them and he is not waiting on any form. I informed the pt that I will update his chart with this information and to not accept any calls from [employer] [facility] on his behalf. The pt agreed.   Please advise.

## 2023-08-11 NOTE — Telephone Encounter (Signed)
Noted  

## 2023-08-12 ENCOUNTER — Other Ambulatory Visit: Payer: Medicare Other

## 2023-08-12 NOTE — Progress Notes (Deleted)
08/12/2023  Patient ID: Adam [name], male   DOB: [birth year], 72 y.o.   MRN: [id number]  Attempted to contact patient for scheduled appointment for medication management. Left HIPAA compliant message for patient to return my call at their convenience.   Sherrill Raring, PharmD Clinical Pharmacist [phone]

## 2023-08-19 DIAGNOSIS — Z136 Encounter for screening for cardiovascular disorders: Secondary | ICD-10-CM | POA: Diagnosis not present

## 2023-08-19 DIAGNOSIS — F17210 Nicotine dependence, cigarettes, uncomplicated: Secondary | ICD-10-CM | POA: Diagnosis not present

## 2023-08-22 ENCOUNTER — Other Ambulatory Visit: Payer: Self-pay | Admitting: Family Medicine

## 2023-10-10 DIAGNOSIS — N3281 Overactive bladder: Secondary | ICD-10-CM | POA: Diagnosis not present

## 2023-10-11 DIAGNOSIS — I259 Chronic ischemic heart disease, unspecified: Secondary | ICD-10-CM | POA: Diagnosis not present

## 2023-10-11 DIAGNOSIS — I447 Left bundle-branch block, unspecified: Secondary | ICD-10-CM | POA: Diagnosis not present

## 2023-10-11 DIAGNOSIS — I429 Cardiomyopathy, unspecified: Secondary | ICD-10-CM | POA: Diagnosis not present

## 2023-10-21 ENCOUNTER — Ambulatory Visit (INDEPENDENT_AMBULATORY_CARE_PROVIDER_SITE_OTHER): Payer: Medicare Other

## 2023-10-21 VITALS — Ht 71.0 in | Wt 155.0 lb

## 2023-10-21 DIAGNOSIS — Z0000 Encounter for general adult medical examination without abnormal findings: Secondary | ICD-10-CM | POA: Diagnosis not present

## 2023-10-21 NOTE — Patient Instructions (Addendum)
Mr. Licausi , Thank you for taking time to come for your Medicare Wellness Visit. I appreciate your ongoing commitment to your health goals. Please review the following plan we discussed and let me know if I can assist you in the future.   Referrals/Orders/Follow-Ups/Clinician Recommendations:   This is a list of the screening recommended for you and due dates:  Health Maintenance  Topic Date Due   DTaP/Tdap/Td vaccine (1 - Tdap) Never done   Yearly kidney health urinalysis for diabetes  05/02/2018   COVID-19 Vaccine (3 - Moderna risk series) 02/21/2020   Complete foot exam   05/09/2021   Eye exam for diabetics  10/21/2023*   Zoster (Shingles) Vaccine (1 of 2) 01/19/2024*   Flu Shot  01/23/2024*   Screening for Lung Cancer  10/20/2024*   Colon Cancer Screening  10/20/2024*   Hemoglobin A1C  12/23/2023   Yearly kidney function blood test for diabetes  06/23/2024   Medicare Annual Wellness Visit  10/20/2024   Pneumonia Vaccine  Completed   Hepatitis C Screening  Completed   HPV Vaccine  Aged Out  *Topic was postponed. The date shown is not the original due date.    Advanced directives: (Declined) Advance directive discussed with you today. Even though you declined this today, please call our office should you change your mind, and we can give you the proper paperwork for you to fill out.  Next Medicare Annual Wellness Visit scheduled for next year: Yes

## 2023-10-21 NOTE — Progress Notes (Signed)
Subjective:   Adam [name] is a 72 y.o. male who presents for Medicare Annual/Subsequent preventive examination.  Visit Complete: Virtual I connected with  Adam [name] on 10/21/23 by a audio enabled telemedicine application and verified that I am speaking with the correct person using two identifiers.  Patient Location: Home  Provider Location: Home Office  I discussed the limitations of evaluation and management by telemedicine. The patient expressed understanding and agreed to proceed.  Vital Signs: Because this visit was a virtual/telehealth visit, some criteria may be missing or patient reported. Any vitals not documented were not able to be obtained and vitals that have been documented are patient reported.   Cardiac Risk Factors include: advanced age (>86men, >8 women);diabetes mellitus;male gender     Objective:    Today's Vitals   10/21/23 1050  Weight: 155 lb (70.3 kg)  Height: 5\' 11"  (1.803 m)   Body mass index is 21.62 kg/m.     10/21/2023   10:57 AM 10/15/2022   11:03 AM 09/28/2021    9:08 AM 10/03/2020   11:24 AM 10/01/2019   11:39 AM 04/26/2018    9:36 AM 04/25/2017    9:29 AM  Advanced Directives  Does Patient Have a Medical Advance Directive? No No No No No No No  Would patient like information on creating a medical advance directive? No - Patient declined No - Patient declined  No - Patient declined Yes (MAU/Ambulatory/Procedural Areas - Information given)  No - Patient declined    Current Medications (verified) Outpatient Encounter Medications as of 10/21/2023  Medication Sig   aspirin EC 81 MG tablet Take 81 mg by mouth daily. Swallow whole.   atorvastatin (LIPITOR) 40 MG tablet Take 1 tablet (40 mg total) by mouth daily.   clopidogrel (PLAVIX) 75 MG tablet Take 1 tablet (75 mg total) by mouth daily.   empagliflozin (JARDIANCE) 25 MG TABS tablet TAKE ONE TABLET BY MOUTH BEFORE BREAKFAST daily   finasteride (PROSCAR) 5 MG tablet Take 1 tablet (5 mg  total) by mouth daily.   furosemide (LASIX) 40 MG tablet Take 1 tablet (40 mg total) by mouth daily as needed for edema or fluid.   glipiZIDE (GLUCOTROL) 5 MG tablet Take 1 tablet (5 mg total) by mouth 2 (two) times daily with breakfast and supper.   glucose blood (ACCU-CHEK AVIVA PLUS) test strip Use to check blood sugars once daily. Dx: E11.65   Lancet Devices (ACCU-CHEK SOFTCLIX) lancets Use to check blood sugars once daily. Dx: E11.65   losartan (COZAAR) 25 MG tablet Take 0.5 tablets (12.5 mg total) by mouth daily. 1/2 TAB DAILY   metFORMIN (GLUCOPHAGE) 500 MG tablet TAKE TWO TABLETS BY MOUTH TWICE DAILY   metoprolol succinate (TOPROL-XL) 25 MG 24 hr tablet Take 1 tablet (25 mg total) by mouth daily.   ONE TOUCH LANCETS MISC Use 3-4 times as week as directed   sildenafil (REVATIO) 20 MG tablet Take 2 to 5 tablets one hour prior to sexual activity.   sitaGLIPtin (JANUVIA) 100 MG tablet TAKE ONE TABLET BY MOUTH EVERY MORNING   tamsulosin (FLOMAX) 0.4 MG CAPS capsule TAKE ONE CAPSULE BY MOUTH EVERY MORNING Needs appointment for further refills   No facility-administered encounter medications on file as of 10/21/2023.    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   PERIPHERAL VASCULAR DISEASE 08/27/2010  WEIGHT LOSS 08/27/2010   Past Surgical History:  Procedure Laterality Date   BACK SURGERY  2003   CHOLECYSTECTOMY  1990   KNEE SURGERY  1995   left   Family History  Problem Relation Age of Onset   Heart disease Father    Cancer Paternal Grandfather        lung   Social History   Socioeconomic History   Marital status: Single    Spouse name: Not on file   Number of children: 3   Years of education: 4 years college   Highest education level: Bachelor's degree (e.g., BA, AB, BS)  Occupational History   Not on file   Tobacco Use   Smoking status: Every Day    Current packs/day: 1.00    Average packs/day: 1 pack/day for 50.0 years (50.0 ttl pk-yrs)    Types: Cigarettes   Smokeless tobacco: Never  Vaping Use   Vaping status: Never Used  Substance and Sexual Activity   Alcohol use: Not Currently    Alcohol/week: 0.0 standard drinks of alcohol    Comment: occasional   Drug use: No   Sexual activity: Not on file  Other Topics Concern   Not on file  Social History Narrative   Not on file   Social Drivers of Health   Financial Resource Strain: Low Risk  (10/21/2023)   Overall Financial Resource Strain (CARDIA)    Difficulty of Paying Living Expenses: Not hard at all  Food Insecurity: No Food Insecurity (10/21/2023)   Hunger Vital Sign    Worried About Running Out of Food in the Last Year: Never true    Ran Out of Food in the Last Year: Never true  Transportation Needs: No Transportation Needs (10/21/2023)   PRAPARE - Administrator, Civil Service (Medical): No    Lack of Transportation (Non-Medical): No  Physical Activity: Inactive (10/21/2023)   Exercise Vital Sign    Days of Exercise per Week: 0 days    Minutes of Exercise per Session: 0 min  Stress: No Stress Concern Present (10/21/2023)   [employer] of Occupational Health - Occupational Stress Questionnaire    Feeling of Stress : Not at all  Social Connections: Moderately Integrated (10/21/2023)   Social Connection and Isolation Panel [NHANES]    Frequency of Communication with Friends and Family: More than three times a week    Frequency of Social Gatherings with Friends and Family: More than three times a week    Attends Religious Services: More than 4 times per year    Active Member of [employer] or Organizations: Yes    Attends Engineer, structural: More than 4 times per year    Marital Status: Divorced    Tobacco Counseling Ready to quit: No Counseling given: Yes   Clinical Intake:  Pre-visit  preparation completed: Yes  Pain : No/denies pain     BMI - recorded: 21.62 Nutritional Status: BMI of 19-24  Normal Nutritional Risks: None Diabetes: No  How often do you need to have someone help you when you read instructions, pamphlets, or other written materials from your doctor or pharmacy?: 1 - Never  Interpreter Needed?: No  Information entered by :: Theresa Mulligan LPN   Activities of Daily Living    10/21/2023   10:53 AM  In your present state of health, do you have any difficulty performing the following activities:  Hearing? 0  Vision? 0  Difficulty concentrating or making decisions? 0  Walking or  climbing stairs? 0  Dressing or bathing? 0  Doing errands, shopping? 0  Preparing Food and eating ? N  Using the Toilet? N  In the past six months, have you accidently leaked urine? N  Do you have problems with loss of bowel control? N  Managing your Medications? N  Managing your Finances? N  Housekeeping or managing your Housekeeping? N    Patient Care Team: Kristian Covey, MD as PCP - [facility], [facility] (Specialist) Sherrill Raring, [facility] (Pharmacist)  Indicate any recent Medical Services you may have received from other than [facility] providers in the past year (date may be approximate).     Assessment:   This is a routine wellness examination for Daylan.  Hearing/Vision screen Hearing Screening - Comments:: Denies hearing difficulties   Vision Screening - Comments:: Wears rx glasses - Not up to date with routine eye exams with  Deferred   Goals Addressed               This Visit's Progress     Stay Active (pt-stated)         Depression Screen    10/21/2023   10:56 AM 10/15/2022   11:00 AM 09/28/2021    9:09 AM 10/03/2020   11:26 AM 10/01/2019   11:42 AM 04/26/2018    9:38 AM 04/25/2017    9:29 AM  PHQ 2/9 Scores  PHQ - 2 Score 0 0 0 0 0 0 0  PHQ- 9 Score    0       Fall Risk    10/21/2023   10:57 AM 10/15/2022   11:02 AM 09/28/2021     9:08 AM 10/03/2020   11:26 AM 10/01/2019   11:42 AM  Fall Risk   Falls in the past year? 0 0 0 0 0  Number falls in past yr: 0 0  0 0  Injury with Fall? 0 0  0   Risk for fall due to : No Fall Risks No Fall Risks Medication side effect Medication side effect Medication side effect  Follow up Falls prevention discussed Falls prevention discussed Falls evaluation completed;Education provided;Falls prevention discussed Falls evaluation completed;Falls prevention discussed Falls evaluation completed;Education provided;Falls prevention discussed    MEDICARE RISK AT HOME: Medicare Risk at Home Any stairs in or around the home?: Yes If so, are there any without handrails?: No Home free of loose throw rugs in walkways, pet beds, electrical cords, etc?: Yes Adequate lighting in your home to reduce risk of falls?: Yes Life alert?: No Use of a cane, walker or w/c?: No Grab bars in the bathroom?: No Shower chair or bench in shower?: No Elevated toilet seat or a handicapped toilet?: No  TIMED UP AND GO:  Was the test performed?  No    Cognitive Function:    04/26/2018    9:39 AM  MMSE - Mini Mental State Exam  Not completed: --        10/21/2023   10:58 AM 10/15/2022   11:03 AM 09/28/2021    9:10 AM 10/01/2019   11:44 AM  6CIT Screen  What Year? 0 points 0 points 0 points 0 points  What month? 0 points 0 points 0 points 0 points  What time? 0 points 0 points 0 points 0 points  Count back from 20 0 points 0 points 0 points 0 points  Months in reverse 0 points 0 points 2 points 0 points  Repeat phrase 0 points 0 points 4 points 0  points  Total Score 0 points 0 points 6 points 0 points    Immunizations Immunization History  Administered Date(s) Administered   Moderna Sars-Covid-2 Vaccination 12/27/2019, 01/24/2020   Pneumococcal Conjugate-13 05/09/2014   Pneumococcal Polysaccharide-23 04/25/2017    TDAP status: Due, Education has been provided regarding the importance of  this vaccine. Advised may receive this vaccine at local pharmacy or Health Dept. Aware to provide a copy of the vaccination record if obtained from local pharmacy or Health Dept. Verbalized acceptance and understanding.  Flu Vaccine status: Declined, Education has been provided regarding the importance of this vaccine but patient still declined. Advised may receive this vaccine at local pharmacy or Health Dept. Aware to provide a copy of the vaccination record if obtained from local pharmacy or Health Dept. Verbalized acceptance and understanding.  Pneumococcal vaccine status: Up to date  Covid-19 vaccine status: Declined, Education has been provided regarding the importance of this vaccine but patient still declined. Advised may receive this vaccine at local pharmacy or Health Dept.or vaccine clinic. Aware to provide a copy of the vaccination record if obtained from local pharmacy or Health Dept. Verbalized acceptance and understanding.  Qualifies for Shingles Vaccine? Yes   Zostavax completed No   Shingrix Completed?: No.    Education has been provided regarding the importance of this vaccine. Patient has been advised to call insurance company to determine out of pocket expense if they have not yet received this vaccine. Advised may also receive vaccine at local pharmacy or Health Dept. Verbalized acceptance and understanding.  Screening Tests Health Maintenance  Topic Date Due   DTaP/Tdap/Td (1 - Tdap) Never done   Diabetic kidney evaluation - Urine ACR  05/02/2018   COVID-19 Vaccine (3 - Moderna risk series) 02/21/2020   FOOT EXAM  05/09/2021   OPHTHALMOLOGY EXAM  10/21/2023 (Originally 07/11/2021)   Zoster Vaccines- Shingrix (1 of 2) 01/19/2024 (Originally 11/27/1967)   INFLUENZA VACCINE  01/23/2024 (Originally 05/26/2023)   Lung Cancer Screening  10/20/2024 (Originally 11/26/1998)   Colonoscopy  10/20/2024 (Originally 11/26/1993)   HEMOGLOBIN A1C  12/23/2023   Diabetic kidney evaluation - eGFR  measurement  06/23/2024   Medicare Annual Wellness (AWV)  10/20/2024   Pneumonia Vaccine 4+ Years old  Completed   Hepatitis C Screening  Completed   HPV VACCINES  Aged Out    Health Maintenance  Health Maintenance Due  Topic Date Due   DTaP/Tdap/Td (1 - Tdap) Never done   Diabetic kidney evaluation - Urine ACR  05/02/2018   COVID-19 Vaccine (3 - Moderna risk series) 02/21/2020   FOOT EXAM  05/09/2021    Colorectal cancer screening: Referral to GI placed Patient declined. Pt aware the office will call re: appt.  Lung Cancer Screening: (Low Dose CT Chest recommended if Age 22-80 years, 20 pack-year currently smoking OR have quit w/in 15years.) does qualify.   Lung Cancer Screening Referral: Patient declined  Additional Screening:  Hepatitis C Screening: does qualify; Completed 05/02/17  Vision Screening: Recommended annual ophthalmology exams for early detection of glaucoma and other disorders of the eye. Is the patient up to date with their annual eye exam?  No  Who is the provider or what is the name of the office in which the patient attends annual eye exams? Deferred If pt is not established with a provider, would they like to be referred to a provider to establish care? No .   Dental Screening: Recommended annual dental exams for proper oral hygiene  Diabetic Foot Exam:  Diabetic Foot Exam: Overdue, Pt has been advised about the importance in completing this exam. Pt is scheduled for diabetic foot exam on Deferred.  Community Resource Referral / Chronic Care Management:  CRR required this visit?  No   CCM required this visit?  No     Plan:     I have personally reviewed and noted the following in the patient's chart:   Medical and social history Use of alcohol, tobacco or illicit drugs  Current medications and supplements including opioid prescriptions. Patient is not currently taking opioid prescriptions. Functional ability and status Nutritional  status Physical activity Advanced directives List of other physicians Hospitalizations, surgeries, and ER visits in previous 12 months Vitals Screenings to include cognitive, depression, and falls Referrals and appointments  In addition, I have reviewed and discussed with patient certain preventive protocols, quality metrics, and best practice recommendations. A written personalized care plan for preventive services as well as general preventive health recommendations were provided to patient.     Tillie Rung, LPN   [id number]/07/[id number]   After Visit Summary: (MyChart) Due to this being a telephonic visit, the after visit summary with patients personalized plan was offered to patient via MyChart   Nurse Notes: None

## 2023-11-25 DIAGNOSIS — N281 Cyst of kidney, acquired: Secondary | ICD-10-CM | POA: Diagnosis not present

## 2023-11-25 DIAGNOSIS — J432 Centrilobular emphysema: Secondary | ICD-10-CM | POA: Diagnosis not present

## 2023-11-25 DIAGNOSIS — Z95810 Presence of automatic (implantable) cardiac defibrillator: Secondary | ICD-10-CM | POA: Diagnosis not present

## 2023-12-02 DIAGNOSIS — C772 Secondary and unspecified malignant neoplasm of intra-abdominal lymph nodes: Secondary | ICD-10-CM | POA: Diagnosis not present

## 2023-12-16 DIAGNOSIS — C772 Secondary and unspecified malignant neoplasm of intra-abdominal lymph nodes: Secondary | ICD-10-CM | POA: Diagnosis not present

## 2024-01-04 ENCOUNTER — Telehealth: Payer: Self-pay

## 2024-01-04 NOTE — Telephone Encounter (Signed)
 Copied from CRM [id number]. Topic: General - Other >> Jan 04, 2024  2:35 PM Sim Boast F wrote: Reason for CRM: [facility] called to make sure that their fax was received. Per clinic access line fax was received. [facility] request that the second page be signed by Friday in order to release labs within 24 hours.

## 2024-01-04 NOTE — Telephone Encounter (Signed)
 Fax not received at this time. Will await fax

## 2024-01-06 ENCOUNTER — Telehealth: Payer: Self-pay | Admitting: Family Medicine

## 2024-01-06 NOTE — Telephone Encounter (Signed)
 Copied from CRM [id number]. Topic: Clinical - Lab/Test Results >> Jan 06, 2024  9:05 AM Theodis Sato wrote: Reason for CRM:  Kara Mead from [facility] will be faxing results over for the 3rd time and states Dr. Caryl Never needs to sign the 2nd page in order release these labs within 24 hours.  Please fax documents back to  [phone]  For any questions, please call [phone]

## 2024-01-06 NOTE — Telephone Encounter (Signed)
 Form given to provider for sign

## 2024-01-10 ENCOUNTER — Telehealth: Payer: Self-pay

## 2024-01-10 NOTE — Telephone Encounter (Signed)
 Copied from CRM [id number]. Topic: General - Other >> Jan 10, 2024  4:00 PM Sonny Dandy B wrote: Reason for CRM: [facility] called regarding a UTI form that was faxed over on  3/12. They are requesting the form is signed by pt's provider and sent back to Fax# [phone]

## 2024-01-11 NOTE — Telephone Encounter (Signed)
 Noted.

## 2024-01-12 ENCOUNTER — Telehealth: Payer: Self-pay | Admitting: *Deleted

## 2024-01-12 NOTE — Telephone Encounter (Signed)
 Copied from CRM [id number]. Topic: General - Other >> Jan 10, 2024  4:00 PM Sonny Dandy B wrote: Reason for CRM: [facility] called regarding a UTI form that was faxed over on  3/12. They are requesting the form is signed by pt's provider and sent back to Fax# [phone] >> Jan 12, 2024  4:03 PM Marica Otter wrote: Kara Mead states she's following up on lab request sent to office, advised caller per notes from provider Dr. Caryl Never didn't order any urine samples for patient and Kara Mead states patient is requesting an at home UTI testing through company [facility]. Please reach out to patient or Kara Mead for clarity.  Kara Mead  [phone] fax

## 2024-01-12 NOTE — Telephone Encounter (Signed)
 Patient reported he did not request this and will follow up with PCP if he begins to have any dysuria or urinary issues.

## 2024-01-13 DIAGNOSIS — I10 Essential (primary) hypertension: Secondary | ICD-10-CM | POA: Diagnosis not present

## 2024-01-13 DIAGNOSIS — Z902 Acquired absence of lung [part of]: Secondary | ICD-10-CM | POA: Diagnosis not present

## 2024-01-13 DIAGNOSIS — E785 Hyperlipidemia, unspecified: Secondary | ICD-10-CM | POA: Diagnosis not present

## 2024-01-13 DIAGNOSIS — I252 Old myocardial infarction: Secondary | ICD-10-CM | POA: Diagnosis not present

## 2024-01-13 DIAGNOSIS — Z79899 Other long term (current) drug therapy: Secondary | ICD-10-CM | POA: Diagnosis not present

## 2024-01-13 DIAGNOSIS — E119 Type 2 diabetes mellitus without complications: Secondary | ICD-10-CM | POA: Diagnosis not present

## 2024-01-13 DIAGNOSIS — Z7984 Long term (current) use of oral hypoglycemic drugs: Secondary | ICD-10-CM | POA: Diagnosis not present

## 2024-01-13 DIAGNOSIS — F17210 Nicotine dependence, cigarettes, uncomplicated: Secondary | ICD-10-CM | POA: Diagnosis not present

## 2024-01-13 DIAGNOSIS — Z7982 Long term (current) use of aspirin: Secondary | ICD-10-CM | POA: Diagnosis not present

## 2024-01-31 DIAGNOSIS — R300 Dysuria: Secondary | ICD-10-CM | POA: Diagnosis not present

## 2024-02-02 DIAGNOSIS — R300 Dysuria: Secondary | ICD-10-CM | POA: Diagnosis not present

## 2024-02-03 DIAGNOSIS — R300 Dysuria: Secondary | ICD-10-CM | POA: Diagnosis not present

## 2024-02-06 DIAGNOSIS — R300 Dysuria: Secondary | ICD-10-CM | POA: Diagnosis not present

## 2024-02-07 DIAGNOSIS — R300 Dysuria: Secondary | ICD-10-CM | POA: Diagnosis not present

## 2024-02-08 DIAGNOSIS — R300 Dysuria: Secondary | ICD-10-CM | POA: Diagnosis not present

## 2024-02-09 DIAGNOSIS — R300 Dysuria: Secondary | ICD-10-CM | POA: Diagnosis not present

## 2024-02-13 DIAGNOSIS — R300 Dysuria: Secondary | ICD-10-CM | POA: Diagnosis not present

## 2024-02-13 DIAGNOSIS — I495 Sick sinus syndrome: Secondary | ICD-10-CM | POA: Diagnosis not present

## 2024-02-13 DIAGNOSIS — I472 Ventricular tachycardia, unspecified: Secondary | ICD-10-CM | POA: Diagnosis not present

## 2024-02-13 DIAGNOSIS — I259 Chronic ischemic heart disease, unspecified: Secondary | ICD-10-CM | POA: Diagnosis not present

## 2024-02-13 DIAGNOSIS — I429 Cardiomyopathy, unspecified: Secondary | ICD-10-CM | POA: Diagnosis not present

## 2024-02-13 DIAGNOSIS — I4892 Unspecified atrial flutter: Secondary | ICD-10-CM | POA: Diagnosis not present

## 2024-02-13 DIAGNOSIS — I447 Left bundle-branch block, unspecified: Secondary | ICD-10-CM | POA: Diagnosis not present

## 2024-02-14 DIAGNOSIS — R300 Dysuria: Secondary | ICD-10-CM | POA: Diagnosis not present

## 2024-02-15 DIAGNOSIS — R300 Dysuria: Secondary | ICD-10-CM | POA: Diagnosis not present

## 2024-02-16 DIAGNOSIS — R300 Dysuria: Secondary | ICD-10-CM | POA: Diagnosis not present

## 2024-02-17 DIAGNOSIS — R300 Dysuria: Secondary | ICD-10-CM | POA: Diagnosis not present

## 2024-02-20 DIAGNOSIS — R300 Dysuria: Secondary | ICD-10-CM | POA: Diagnosis not present

## 2024-02-21 DIAGNOSIS — R300 Dysuria: Secondary | ICD-10-CM | POA: Diagnosis not present

## 2024-02-22 DIAGNOSIS — R300 Dysuria: Secondary | ICD-10-CM | POA: Diagnosis not present

## 2024-02-23 DIAGNOSIS — R350 Frequency of micturition: Secondary | ICD-10-CM | POA: Diagnosis not present

## 2024-02-23 DIAGNOSIS — R300 Dysuria: Secondary | ICD-10-CM | POA: Diagnosis not present

## 2024-02-24 DIAGNOSIS — R300 Dysuria: Secondary | ICD-10-CM | POA: Diagnosis not present

## 2024-02-24 DIAGNOSIS — R350 Frequency of micturition: Secondary | ICD-10-CM | POA: Diagnosis not present

## 2024-02-27 DIAGNOSIS — R300 Dysuria: Secondary | ICD-10-CM | POA: Diagnosis not present

## 2024-02-27 DIAGNOSIS — R350 Frequency of micturition: Secondary | ICD-10-CM | POA: Diagnosis not present

## 2024-02-28 DIAGNOSIS — R350 Frequency of micturition: Secondary | ICD-10-CM | POA: Diagnosis not present

## 2024-02-28 DIAGNOSIS — R300 Dysuria: Secondary | ICD-10-CM | POA: Diagnosis not present

## 2024-02-29 DIAGNOSIS — R300 Dysuria: Secondary | ICD-10-CM | POA: Diagnosis not present

## 2024-02-29 DIAGNOSIS — R350 Frequency of micturition: Secondary | ICD-10-CM | POA: Diagnosis not present

## 2024-03-01 DIAGNOSIS — R350 Frequency of micturition: Secondary | ICD-10-CM | POA: Diagnosis not present

## 2024-03-01 DIAGNOSIS — R300 Dysuria: Secondary | ICD-10-CM | POA: Diagnosis not present

## 2024-03-02 DIAGNOSIS — R300 Dysuria: Secondary | ICD-10-CM | POA: Diagnosis not present

## 2024-03-02 DIAGNOSIS — R350 Frequency of micturition: Secondary | ICD-10-CM | POA: Diagnosis not present

## 2024-03-05 DIAGNOSIS — R350 Frequency of micturition: Secondary | ICD-10-CM | POA: Diagnosis not present

## 2024-03-05 DIAGNOSIS — R300 Dysuria: Secondary | ICD-10-CM | POA: Diagnosis not present

## 2024-03-06 DIAGNOSIS — R300 Dysuria: Secondary | ICD-10-CM | POA: Diagnosis not present

## 2024-03-06 DIAGNOSIS — R350 Frequency of micturition: Secondary | ICD-10-CM | POA: Diagnosis not present

## 2024-03-07 DIAGNOSIS — R350 Frequency of micturition: Secondary | ICD-10-CM | POA: Diagnosis not present

## 2024-03-07 DIAGNOSIS — R300 Dysuria: Secondary | ICD-10-CM | POA: Diagnosis not present

## 2024-03-08 DIAGNOSIS — R300 Dysuria: Secondary | ICD-10-CM | POA: Diagnosis not present

## 2024-03-08 DIAGNOSIS — R350 Frequency of micturition: Secondary | ICD-10-CM | POA: Diagnosis not present

## 2024-03-09 DIAGNOSIS — R300 Dysuria: Secondary | ICD-10-CM | POA: Diagnosis not present

## 2024-03-09 DIAGNOSIS — R350 Frequency of micturition: Secondary | ICD-10-CM | POA: Diagnosis not present

## 2024-03-13 DIAGNOSIS — R300 Dysuria: Secondary | ICD-10-CM | POA: Diagnosis not present

## 2024-03-13 DIAGNOSIS — R350 Frequency of micturition: Secondary | ICD-10-CM | POA: Diagnosis not present

## 2024-03-14 DIAGNOSIS — R350 Frequency of micturition: Secondary | ICD-10-CM | POA: Diagnosis not present

## 2024-03-14 DIAGNOSIS — R300 Dysuria: Secondary | ICD-10-CM | POA: Diagnosis not present

## 2024-03-21 ENCOUNTER — Other Ambulatory Visit: Payer: Self-pay | Admitting: Family Medicine

## 2024-03-23 DIAGNOSIS — N3281 Overactive bladder: Secondary | ICD-10-CM | POA: Diagnosis not present

## 2024-03-23 DIAGNOSIS — C772 Secondary and unspecified malignant neoplasm of intra-abdominal lymph nodes: Secondary | ICD-10-CM | POA: Diagnosis not present

## 2024-03-27 DIAGNOSIS — I509 Heart failure, unspecified: Secondary | ICD-10-CM | POA: Diagnosis not present

## 2024-03-27 DIAGNOSIS — Z950 Presence of cardiac pacemaker: Secondary | ICD-10-CM | POA: Diagnosis not present

## 2024-03-27 DIAGNOSIS — I361 Nonrheumatic tricuspid (valve) insufficiency: Secondary | ICD-10-CM | POA: Diagnosis not present

## 2024-05-15 DIAGNOSIS — I495 Sick sinus syndrome: Secondary | ICD-10-CM | POA: Diagnosis not present

## 2024-05-15 DIAGNOSIS — I447 Left bundle-branch block, unspecified: Secondary | ICD-10-CM | POA: Diagnosis not present

## 2024-05-15 DIAGNOSIS — I259 Chronic ischemic heart disease, unspecified: Secondary | ICD-10-CM | POA: Diagnosis not present

## 2024-05-15 DIAGNOSIS — I472 Ventricular tachycardia, unspecified: Secondary | ICD-10-CM | POA: Diagnosis not present

## 2024-05-15 DIAGNOSIS — I255 Ischemic cardiomyopathy: Secondary | ICD-10-CM | POA: Diagnosis not present

## 2024-05-15 DIAGNOSIS — I4892 Unspecified atrial flutter: Secondary | ICD-10-CM | POA: Diagnosis not present

## 2024-05-20 ENCOUNTER — Other Ambulatory Visit: Payer: Self-pay | Admitting: Family Medicine

## 2024-06-22 DIAGNOSIS — N3281 Overactive bladder: Secondary | ICD-10-CM | POA: Diagnosis not present

## 2024-06-22 DIAGNOSIS — C772 Secondary and unspecified malignant neoplasm of intra-abdominal lymph nodes: Secondary | ICD-10-CM | POA: Diagnosis not present

## 2024-07-15 ENCOUNTER — Other Ambulatory Visit: Payer: Self-pay | Admitting: Family Medicine

## 2024-07-15 DIAGNOSIS — E113299 Type 2 diabetes mellitus with mild nonproliferative diabetic retinopathy without macular edema, unspecified eye: Secondary | ICD-10-CM

## 2024-07-17 DIAGNOSIS — I447 Left bundle-branch block, unspecified: Secondary | ICD-10-CM | POA: Diagnosis not present

## 2024-07-17 DIAGNOSIS — I429 Cardiomyopathy, unspecified: Secondary | ICD-10-CM | POA: Diagnosis not present

## 2024-07-17 DIAGNOSIS — Z95810 Presence of automatic (implantable) cardiac defibrillator: Secondary | ICD-10-CM | POA: Diagnosis not present

## 2024-07-17 DIAGNOSIS — I4892 Unspecified atrial flutter: Secondary | ICD-10-CM | POA: Diagnosis not present

## 2024-07-17 DIAGNOSIS — I472 Ventricular tachycardia, unspecified: Secondary | ICD-10-CM | POA: Diagnosis not present

## 2024-07-17 DIAGNOSIS — I259 Chronic ischemic heart disease, unspecified: Secondary | ICD-10-CM | POA: Diagnosis not present

## 2024-07-17 DIAGNOSIS — I495 Sick sinus syndrome: Secondary | ICD-10-CM | POA: Diagnosis not present

## 2024-07-19 ENCOUNTER — Telehealth: Payer: Self-pay | Admitting: *Deleted

## 2024-07-19 NOTE — Telephone Encounter (Signed)
 Copied from CRM #[id number]. Topic: Clinical - Medical Advice >> Jul 19, 2024 11:19 AM Anairis L wrote: Reason for CRM: Slater from [facility] is calling in regarding lab requisition form that have been fax in for review. They are urgent   Fax-[phone] Ph-[phone]

## 2024-07-28 ENCOUNTER — Other Ambulatory Visit: Payer: Self-pay | Admitting: Family Medicine

## 2024-08-14 DIAGNOSIS — I259 Chronic ischemic heart disease, unspecified: Secondary | ICD-10-CM | POA: Diagnosis not present

## 2024-08-14 DIAGNOSIS — I4892 Unspecified atrial flutter: Secondary | ICD-10-CM | POA: Diagnosis not present

## 2024-08-14 DIAGNOSIS — I495 Sick sinus syndrome: Secondary | ICD-10-CM | POA: Diagnosis not present

## 2024-08-14 DIAGNOSIS — I447 Left bundle-branch block, unspecified: Secondary | ICD-10-CM | POA: Diagnosis not present

## 2024-08-14 DIAGNOSIS — I472 Ventricular tachycardia, unspecified: Secondary | ICD-10-CM | POA: Diagnosis not present

## 2024-08-14 DIAGNOSIS — I429 Cardiomyopathy, unspecified: Secondary | ICD-10-CM | POA: Diagnosis not present

## 2024-08-19 ENCOUNTER — Other Ambulatory Visit: Payer: Self-pay | Admitting: Family Medicine

## 2024-08-19 DIAGNOSIS — E113299 Type 2 diabetes mellitus with mild nonproliferative diabetic retinopathy without macular edema, unspecified eye: Secondary | ICD-10-CM

## 2024-09-01 ENCOUNTER — Other Ambulatory Visit: Payer: Self-pay | Admitting: Family Medicine

## 2024-09-04 ENCOUNTER — Other Ambulatory Visit: Payer: Self-pay | Admitting: Family Medicine

## 2024-09-05 ENCOUNTER — Other Ambulatory Visit: Payer: Self-pay | Admitting: Family Medicine

## 2024-09-05 DIAGNOSIS — E113299 Type 2 diabetes mellitus with mild nonproliferative diabetic retinopathy without macular edema, unspecified eye: Secondary | ICD-10-CM

## 2024-09-06 ENCOUNTER — Telehealth: Payer: Self-pay

## 2024-09-06 NOTE — Telephone Encounter (Signed)
 Patient is overdue for PCP follow up. Called patient. No answer and Vm was full.

## 2024-09-07 ENCOUNTER — Encounter: Payer: Self-pay | Admitting: Family Medicine

## 2024-09-07 ENCOUNTER — Ambulatory Visit (INDEPENDENT_AMBULATORY_CARE_PROVIDER_SITE_OTHER): Admitting: Family Medicine

## 2024-09-07 VITALS — BP 130/60 | HR 87 | Temp 98.3°F | Wt 153.8 lb

## 2024-09-07 DIAGNOSIS — E785 Hyperlipidemia, unspecified: Secondary | ICD-10-CM | POA: Diagnosis not present

## 2024-09-07 DIAGNOSIS — Z7984 Long term (current) use of oral hypoglycemic drugs: Secondary | ICD-10-CM

## 2024-09-07 DIAGNOSIS — E113299 Type 2 diabetes mellitus with mild nonproliferative diabetic retinopathy without macular edema, unspecified eye: Secondary | ICD-10-CM

## 2024-09-07 DIAGNOSIS — L821 Other seborrheic keratosis: Secondary | ICD-10-CM | POA: Diagnosis not present

## 2024-09-07 DIAGNOSIS — Z8546 Personal history of malignant neoplasm of prostate: Secondary | ICD-10-CM | POA: Diagnosis not present

## 2024-09-07 LAB — MICROALBUMIN / CREATININE URINE RATIO
Creatinine,U: 27.5 mg/dL
Microalb Creat Ratio: 86.5 mg/g — ABNORMAL HIGH (ref 0.0–30.0)
Microalb, Ur: 2.4 mg/dL — ABNORMAL HIGH (ref 0.0–1.9)

## 2024-09-07 LAB — CBC WITH DIFFERENTIAL/PLATELET
Basophils Absolute: 0.1 K/uL (ref 0.0–0.1)
Basophils Relative: 1.4 % (ref 0.0–3.0)
Eosinophils Absolute: 0.1 K/uL (ref 0.0–0.7)
Eosinophils Relative: 2.2 % (ref 0.0–5.0)
HCT: 42.3 % (ref 39.0–52.0)
Hemoglobin: 14.3 g/dL (ref 13.0–17.0)
Lymphocytes Relative: 16.4 % (ref 12.0–46.0)
Lymphs Abs: 0.9 K/uL (ref 0.7–4.0)
MCHC: 33.8 g/dL (ref 30.0–36.0)
MCV: 90.6 fl (ref 78.0–100.0)
Monocytes Absolute: 0.5 K/uL (ref 0.1–1.0)
Monocytes Relative: 9.7 % (ref 3.0–12.0)
Neutro Abs: 3.9 K/uL (ref 1.4–7.7)
Neutrophils Relative %: 70.3 % (ref 43.0–77.0)
Platelets: 180 K/uL (ref 150.0–400.0)
RBC: 4.67 Mil/uL (ref 4.22–5.81)
RDW: 13.9 % (ref 11.5–15.5)
WBC: 5.5 K/uL (ref 4.0–10.5)

## 2024-09-07 LAB — COMPREHENSIVE METABOLIC PANEL WITH GFR
ALT: 9 U/L (ref 0–53)
AST: 15 U/L (ref 0–37)
Albumin: 4 g/dL (ref 3.5–5.2)
Alkaline Phosphatase: 63 U/L (ref 39–117)
BUN: 15 mg/dL (ref 6–23)
CO2: 29 meq/L (ref 19–32)
Calcium: 9.5 mg/dL (ref 8.4–10.5)
Chloride: 99 meq/L (ref 96–112)
Creatinine, Ser: 1.13 mg/dL (ref 0.40–1.50)
GFR: 63.46 mL/min (ref >60.00)
Glucose, Bld: 168 mg/dL — ABNORMAL HIGH (ref 70–99)
Potassium: 4.6 meq/L (ref 3.5–5.1)
Sodium: 138 meq/L (ref 135–145)
Total Bilirubin: 0.4 mg/dL (ref 0.2–1.2)
Total Protein: 6.3 g/dL (ref 6.0–8.3)

## 2024-09-07 LAB — LIPID PANEL
Cholesterol: 177 mg/dL (ref 0–200)
HDL: 56.8 mg/dL (ref >39.00)
LDL Cholesterol: 96 mg/dL (ref 0–99)
NonHDL: 120.05
Total CHOL/HDL Ratio: 3
Triglycerides: 120 mg/dL (ref 0.0–149.0)
VLDL: 24 mg/dL (ref 0.0–40.0)

## 2024-09-07 LAB — PSA, MEDICARE: PSA: 0 ng/mL — ABNORMAL LOW (ref 0.10–4.00)

## 2024-09-07 MED ORDER — LOSARTAN POTASSIUM 25 MG PO TABS: 12.5000 mg | ORAL_TABLET | Freq: Every day | ORAL | 3 refills | Status: DC

## 2024-09-07 MED ORDER — ATORVASTATIN CALCIUM 40 MG PO TABS: 40.0000 mg | ORAL_TABLET | Freq: Every day | ORAL | 3 refills | Status: AC

## 2024-09-07 MED ORDER — METOPROLOL SUCCINATE ER 25 MG PO TB24: 25.0000 mg | ORAL_TABLET | Freq: Every day | ORAL | 3 refills | Status: AC

## 2024-09-07 MED ORDER — EMPAGLIFLOZIN 25 MG PO TABS: 25.0000 mg | ORAL_TABLET | Freq: Every day | ORAL | 3 refills | Status: AC

## 2024-09-07 MED ORDER — GLIPIZIDE 5 MG PO TABS: ORAL_TABLET | ORAL | 3 refills | Status: AC

## 2024-09-07 MED ORDER — CLOPIDOGREL BISULFATE 75 MG PO TABS: 75.0000 mg | ORAL_TABLET | Freq: Every day | ORAL | 3 refills | Status: AC

## 2024-09-07 MED ORDER — SITAGLIPTIN PHOSPHATE 100 MG PO TABS
100.0000 mg | ORAL_TABLET | Freq: Every morning | ORAL | 3 refills | Status: AC
Start: 2024-09-07 — End: ?

## 2024-09-07 MED ORDER — METFORMIN HCL 500 MG PO TABS: 1000.0000 mg | ORAL_TABLET | Freq: Two times a day (BID) | ORAL | 3 refills | Status: AC

## 2024-09-07 MED ORDER — TAMSULOSIN HCL 0.4 MG PO CAPS
ORAL_CAPSULE | ORAL | 3 refills | Status: AC
Start: 2024-09-07 — End: ?

## 2024-09-07 NOTE — Progress Notes (Signed)
 Established Patient Office Visit  Subjective   Patient ID: Adam [name], male    DOB: Jun 17, 1951  Age: 73 y.o. MRN: [id number]  Chief Complaint  Patient presents with   Medical Management of Chronic Issues    HPI   Adam [name] is seen today for chronic medical follow-up.  He is actually followed by multiple specialists including cardiology and neurology.  He was diagnosed with prostate cancer within the past year.  Treated with radiation therapy.  He is requesting follow-up PSA today.  He is not sure exactly when his last PSA was.  He has history of CAD, congestive heart failure, peripheral vascular disease, ongoing nicotine  use, type 2 diabetes, osteoporosis, hyperlipidemia.  We reviewed his long list of medications and he seemed unsure of couple medications especially whether he was taking glipizide  or not.  Does not monitor blood sugars regularly.  Smokes 1 pack cigarettes per day and no desire to quit.  No recent eye exam.  Occasional pain in lower extremities with ambulation but no progressive claudication symptoms.  No symptoms at rest.  He is overdue for follow-up labs.  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 He has a 50 pack-year smoking history. He has never used smokeless tobacco. He reports that he does not currently use alcohol. He reports that he does not use drugs. family history includes Cancer in his paternal grandfather; Heart disease in his father. Allergies  Allergen Reactions   Naproxen Sodium Swelling    Review of Systems  Constitutional:  Negative for malaise/fatigue.  Eyes:  Negative for blurred vision.   Respiratory:  Negative for shortness of breath.   Cardiovascular:  Negative for chest pain.  Gastrointestinal:  Negative for abdominal pain.  Neurological:  Negative for dizziness, weakness and headaches.      Objective:     BP 130/60   Pulse 87   Temp 98.3 F (36.8 C) (Oral)   Wt 153 lb 12.8 oz (69.8 kg)   SpO2 95%   BMI 21.45 kg/m  BP Readings from Last 3 Encounters:  09/07/24 130/60  06/24/23 130/60  04/23/22 110/60   Wt Readings from Last 3 Encounters:  09/07/24 153 lb 12.8 oz (69.8 kg)  10/21/23 155 lb (70.3 kg)  06/24/23 155 lb 4.8 oz (70.4 kg)      Physical Exam Vitals reviewed.  Constitutional:      Appearance: He is well-developed.  HENT:     Right Ear: External ear normal.     Left Ear: External ear normal.  Eyes:     Pupils: Pupils are equal, round, and reactive to light.     Comments: He has fairly large nodular hyperkeratotic lesion intercanthus right eye.  This looks typical for seborrheic keratosis.  Neck:     Thyroid : No thyromegaly.  Cardiovascular:     Rate and Rhythm: Normal rate and regular rhythm.     Comments: Feet are slightly cool to touch.  He has faintly palpable dorsalis pedis pulses. Pulmonary:     Effort: Pulmonary effort is normal. No respiratory distress.     Breath sounds: Normal breath sounds.  No wheezing or rales.  Musculoskeletal:     Cervical back: Neck supple.  Neurological:     Mental Status: He is alert and oriented to person, place, and time.      No results found for any visits on 09/07/24.    The ASCVD Risk score (Arnett DK, et al., 2019) failed to calculate for the following reasons:   Risk score cannot be calculated because patient has a medical history suggesting prior/existing ASCVD    Assessment & Plan:   #1 history of CAD.  Patient followed by cardiology.  On multiple medications including atorvastatin  and Jardiance .  Denies any recent chest pains.  #2 hyperlipidemia.  Goal LDL less than 55.  Recheck  fasting lipid today along with CMP  # 3  type 2 diabetes.  History of poor control.  Recheck A1c today.  Check urine microalbumin screen.  Strongly advised to set up diabetic eye exam.  Also need him to confirm whether he is taking glipizide   #4 large seborrheic keratosis inner canthus right eye.  Reassured this looks benign but is bothersome because of location.  He will check with ophthalmologist regarding possible excision  #5 ongoing nicotine  use.  Low motivation to quit.  Strongly advised to quit especially in view of his CAD history.  Also has likely significant peripheral vascular disease with very poor pulses feet   No follow-ups on file.    Wolm Scarlet, MD

## 2024-09-10 ENCOUNTER — Ambulatory Visit: Payer: Self-pay | Admitting: Family Medicine

## 2024-09-10 LAB — HEMOGLOBIN A1C: Hgb A1c MFr Bld: 7.8 % — ABNORMAL HIGH (ref 4.6–6.5)

## 2024-09-11 MED ORDER — LOSARTAN POTASSIUM 50 MG PO TABS: 50.0000 mg | ORAL_TABLET | Freq: Every day | ORAL | 0 refills | Status: AC

## 2024-09-11 MED ORDER — EZETIMIBE 10 MG PO TABS
10.0000 mg | ORAL_TABLET | Freq: Every day | ORAL | 0 refills | Status: AC
Start: 2024-09-11 — End: ?

## 2024-09-12 ENCOUNTER — Telehealth: Payer: Self-pay

## 2024-09-12 NOTE — Telephone Encounter (Signed)
 Copied from CRM [id number]. Topic: Clinical - Prescription Issue >> Sep 12, 2024 11:34 AM Adam [name] wrote: Reason for CRM: Patient called in to advise Dr. Santiago did not put sitaGLIPtin  (JANUVIA ) 100 MG tablet on his list of medication and stated he takes that every day, would like to know if he still needs to take it or not.

## 2024-09-12 NOTE — Telephone Encounter (Signed)
 Looks like Januvia  is on med list?

## 2024-09-12 NOTE — Telephone Encounter (Signed)
 Patient informed to take medication as prescribed by PCP as medication is on his list

## 2024-09-28 ENCOUNTER — Telehealth: Payer: Self-pay

## 2024-09-28 NOTE — Telephone Encounter (Signed)
 Copied from CRM #[id number]. Topic: Clinical - Lab/Test Results >> Sep 28, 2024 11:53 AM Dedra B wrote: Reason for CRM: Tabitha from Dr. Lia office call to request pt's recent lab results while he's there in clinic. Called [facility] and was told to tell her to fax over a medical release form and they would be sent.

## 2024-09-28 NOTE — Telephone Encounter (Signed)
 Noted

## 2024-10-03 ENCOUNTER — Other Ambulatory Visit: Payer: Self-pay | Admitting: Family Medicine
